# Patient Record
Sex: Male | Born: 1950 | Race: Black or African American | Hispanic: No | Marital: Married | Smoking: Former smoker
Health system: Southern US, Community
[De-identification: ages and names within clinical notes are randomized; demographics above are authoritative.]

## PROBLEM LIST (undated history)

## (undated) DIAGNOSIS — I10 Essential (primary) hypertension: Secondary | ICD-10-CM

## (undated) DIAGNOSIS — G4730 Sleep apnea, unspecified: Secondary | ICD-10-CM

## (undated) DIAGNOSIS — M1990 Unspecified osteoarthritis, unspecified site: Secondary | ICD-10-CM

## (undated) DIAGNOSIS — H409 Unspecified glaucoma: Secondary | ICD-10-CM

## (undated) DIAGNOSIS — E785 Hyperlipidemia, unspecified: Secondary | ICD-10-CM

## (undated) DIAGNOSIS — E119 Type 2 diabetes mellitus without complications: Secondary | ICD-10-CM

## (undated) HISTORY — PX: TONSILLECTOMY: SUR1361

## (undated) HISTORY — DX: Type 2 diabetes mellitus without complications: E11.9

## (undated) HISTORY — PX: APPENDECTOMY: SHX54

## (undated) HISTORY — DX: Hyperlipidemia, unspecified: E78.5

---

## 2005-04-07 ENCOUNTER — Ambulatory Visit: Payer: Self-pay | Admitting: Family Medicine

## 2005-08-03 ENCOUNTER — Ambulatory Visit: Payer: Self-pay | Admitting: Gastroenterology

## 2007-04-26 ENCOUNTER — Ambulatory Visit: Payer: Self-pay | Admitting: Family Medicine

## 2007-05-16 ENCOUNTER — Ambulatory Visit: Payer: Self-pay | Admitting: Family Medicine

## 2007-05-17 ENCOUNTER — Ambulatory Visit: Payer: Self-pay | Admitting: Family Medicine

## 2008-06-11 ENCOUNTER — Ambulatory Visit: Payer: Self-pay | Admitting: Family Medicine

## 2008-12-04 ENCOUNTER — Ambulatory Visit: Payer: Self-pay | Admitting: Family Medicine

## 2008-12-09 ENCOUNTER — Ambulatory Visit: Payer: Self-pay | Admitting: Urology

## 2009-03-21 ENCOUNTER — Ambulatory Visit: Payer: Self-pay | Admitting: Family Medicine

## 2012-07-11 ENCOUNTER — Ambulatory Visit: Payer: Self-pay | Admitting: Family Medicine

## 2012-09-13 ENCOUNTER — Ambulatory Visit: Payer: Self-pay | Admitting: Family Medicine

## 2013-05-23 ENCOUNTER — Other Ambulatory Visit: Payer: Self-pay | Admitting: Neurosurgery

## 2013-05-30 ENCOUNTER — Encounter (HOSPITAL_COMMUNITY): Payer: Self-pay | Admitting: Pharmacy Technician

## 2013-06-09 NOTE — Pre-Procedure Instructions (Signed)
[place] - Preparing for Surgery  Before surgery, you can play an important role.  Because skin is not sterile, your skin needs to be as free of germs as possible.  You can reduce the number of germs on you skin by washing with CHG (chlorahexidine gluconate) soap before surgery.  CHG is an antiseptic cleaner which kills germs and bonds with the skin to continue killing germs even after washing.  Please DO NOT use if you have an allergy to CHG or antibacterial soaps.  If your skin becomes reddened/irritated stop using the CHG and inform your nurse when you arrive at Short Stay.  Do not shave (including legs and underarms) for at least 48 hours prior to the first CHG shower.  You may shave your face.  Please follow these instructions carefully:   1.  Shower with CHG Soap the night before surgery and the morning of Surgery.  2.  If you choose to wash your hair, wash your hair first as usual with your normal shampoo.  3.  After you shampoo, rinse your hair and body thoroughly to remove the shampoo.  4.  Use CHG as you would any other liquid soap.  You can apply CHG directly to the skin and wash gently with scrungie or a clean washcloth.  5.  Apply the CHG Soap to your body ONLY FROM THE NECK DOWN.  Do not use on open wounds or open sores.  Avoid contact with your eyes, ears, mouth and genitals (private parts).  Wash genitals (private parts) with your normal soap.  6.  Wash thoroughly, paying special attention to the area where your surgery will be performed.  7.  Thoroughly rinse your body with warm water from the neck down.  8.  DO NOT shower/wash with your normal soap after using and rinsing off the CHG Soap.  9.  Pat yourself dry with a clean towel.            10.  Wear clean pajamas.            11.  Place clean sheets on your bed the night of your first shower and do not sleep with pets.  Day of Surgery  Do not apply any lotions the morning of surgery.  Please wear clean clothes to the  hospital/surgery center.   

## 2013-06-09 NOTE — Pre-Procedure Instructions (Signed)
Mason Johnson  06/09/2013   Your procedure is scheduled on:  May 19  Report to [facility] Admitting at 05:30 AM.  Call this number if you have problems the morning of surgery: [phone]   Remember:   Do not eat food or drink liquids after midnight.   Take these medicines the morning of surgery with A SIP OF WATER: Bystolic, Eye drops, nasal spray   STOP Aspirin Tuesday, May 12   STOP/ Do not take Aspirin, Aleve, Naproxen, Advil, Ibuprofen, Vitamin, Herbs, or Supplements starting May 12   Do not wear jewelry, make-up or nail polish.  Do not wear lotions, powders, or perfumes. You may wear deodorant.  Do not shave 48 hours prior to surgery. Men may shave face and neck.  Do not bring valuables to the hospital.  [facility] is not responsible for any belongings or valuables.               Contacts, dentures or bridgework may not be worn into surgery.  Leave suitcase in the car. After surgery it may be brought to your room.  For patients admitted to the hospital, discharge time is determined by your treatment team.               Special Instructions: See [facility] Preparing For Surgery   Please read over the following fact sheets that you were given: Pain Booklet, Coughing and Deep Breathing and Surgical Site Infection Prevention

## 2013-06-11 ENCOUNTER — Encounter (HOSPITAL_COMMUNITY): Payer: Self-pay

## 2013-06-11 ENCOUNTER — Encounter (HOSPITAL_COMMUNITY)
Admission: RE | Admit: 2013-06-11 | Discharge: 2013-06-11 | Disposition: A | Discharge status: Home / Self Care | Source: Ambulatory Visit | Attending: Anesthesiology | Admitting: Anesthesiology

## 2013-06-11 ENCOUNTER — Encounter (HOSPITAL_COMMUNITY)
Admission: RE | Admit: 2013-06-11 | Discharge: 2013-06-11 | Disposition: A | Discharge status: Home / Self Care | Source: Ambulatory Visit | Attending: Neurosurgery | Admitting: Neurosurgery

## 2013-06-11 DIAGNOSIS — Z01812 Encounter for preprocedural laboratory examination: Secondary | ICD-10-CM | POA: Insufficient documentation

## 2013-06-11 DIAGNOSIS — Z01818 Encounter for other preprocedural examination: Secondary | ICD-10-CM | POA: Insufficient documentation

## 2013-06-11 DIAGNOSIS — Z01810 Encounter for preprocedural cardiovascular examination: Secondary | ICD-10-CM | POA: Insufficient documentation

## 2013-06-11 HISTORY — DX: Sleep apnea, unspecified: G47.30

## 2013-06-11 HISTORY — DX: Essential (primary) hypertension: I10

## 2013-06-11 HISTORY — DX: Unspecified glaucoma: H40.9

## 2013-06-11 HISTORY — DX: Unspecified osteoarthritis, unspecified site: M19.90

## 2013-06-11 LAB — CBC
HCT: 35.9 % — ABNORMAL LOW (ref 39.0–52.0)
Hemoglobin: 11.3 g/dL — ABNORMAL LOW (ref 13.0–17.0)
MCH: 23.8 pg — ABNORMAL LOW (ref 26.0–34.0)
MCHC: 31.5 g/dL (ref 30.0–36.0)
MCV: 75.7 fL — ABNORMAL LOW (ref 78.0–100.0)
Platelets: 330 10*3/uL (ref 150–400)
RBC: 4.74 MIL/uL (ref 4.22–5.81)
RDW: 18.6 % — ABNORMAL HIGH (ref 11.5–15.5)
WBC: 9.4 10*3/uL (ref 4.0–10.5)

## 2013-06-11 LAB — SURGICAL PCR SCREEN
MRSA, PCR: NEGATIVE
Staphylococcus aureus: NEGATIVE

## 2013-06-11 LAB — BASIC METABOLIC PANEL
BUN: 20 mg/dL (ref 6–23)
CO2: 24 mEq/L (ref 19–32)
Calcium: 9.4 mg/dL (ref 8.4–10.5)
Chloride: 104 mEq/L (ref 96–112)
Creatinine, Ser: 1.18 mg/dL (ref 0.50–1.35)
GFR calc Af Amer: 74 mL/min — ABNORMAL LOW (ref >90)
GFR calc non Af Amer: 64 mL/min — ABNORMAL LOW (ref >90)
Glucose, Bld: 130 mg/dL — ABNORMAL HIGH (ref 70–99)
Potassium: 4.2 mEq/L (ref 3.7–5.3)
Sodium: 141 mEq/L (ref 137–147)

## 2013-06-11 NOTE — Progress Notes (Signed)
Primary physician - dr. Ardine Bjork - [facility] in [place] Saw cardiologist about 3 years ago. Cannot remember his name. Had stress test and ekg at that appointment 3 years ago states that everything came back normal

## 2013-06-18 MED ORDER — DEXTROSE 5 % IV SOLN
3.0000 g | INTRAVENOUS | Status: AC
  Administered 2013-06-19: 3 g via INTRAVENOUS
  Filled 2013-06-18: qty 3000

## 2013-06-19 ENCOUNTER — Encounter (HOSPITAL_COMMUNITY): Admitting: Anesthesiology

## 2013-06-19 ENCOUNTER — Encounter (HOSPITAL_COMMUNITY)
Admission: RE | Disposition: A | Discharge status: Home / Self Care | Payer: Self-pay | Source: Ambulatory Visit | Attending: Neurosurgery

## 2013-06-19 ENCOUNTER — Observation Stay (HOSPITAL_COMMUNITY)
Admission: RE | Admit: 2013-06-19 | Discharge: 2013-06-21 | Disposition: A | Discharge status: Home / Self Care | Source: Ambulatory Visit | Attending: Neurosurgery | Admitting: Neurosurgery

## 2013-06-19 ENCOUNTER — Inpatient Hospital Stay (HOSPITAL_COMMUNITY)

## 2013-06-19 ENCOUNTER — Encounter (HOSPITAL_COMMUNITY): Payer: Self-pay | Admitting: *Deleted

## 2013-06-19 ENCOUNTER — Inpatient Hospital Stay (HOSPITAL_COMMUNITY): Admitting: Anesthesiology

## 2013-06-19 DIAGNOSIS — I10 Essential (primary) hypertension: Secondary | ICD-10-CM | POA: Insufficient documentation

## 2013-06-19 DIAGNOSIS — Z7982 Long term (current) use of aspirin: Secondary | ICD-10-CM | POA: Insufficient documentation

## 2013-06-19 DIAGNOSIS — H409 Unspecified glaucoma: Secondary | ICD-10-CM | POA: Insufficient documentation

## 2013-06-19 DIAGNOSIS — G4730 Sleep apnea, unspecified: Secondary | ICD-10-CM | POA: Insufficient documentation

## 2013-06-19 DIAGNOSIS — Z87891 Personal history of nicotine dependence: Secondary | ICD-10-CM | POA: Insufficient documentation

## 2013-06-19 DIAGNOSIS — M129 Arthropathy, unspecified: Secondary | ICD-10-CM | POA: Insufficient documentation

## 2013-06-19 DIAGNOSIS — M48061 Spinal stenosis, lumbar region without neurogenic claudication: Principal | ICD-10-CM | POA: Insufficient documentation

## 2013-06-19 HISTORY — PX: LUMBAR LAMINECTOMY/DECOMPRESSION MICRODISCECTOMY: SHX5026

## 2013-06-19 SURGERY — LUMBAR LAMINECTOMY/DECOMPRESSION MICRODISCECTOMY 3 LEVELS
Anesthesia: General

## 2013-06-19 MED ORDER — PHENYLEPHRINE 40 MCG/ML (10ML) SYRINGE FOR IV PUSH (FOR BLOOD PRESSURE SUPPORT)
PREFILLED_SYRINGE | INTRAVENOUS | Status: AC
  Filled 2013-06-19: qty 10

## 2013-06-19 MED ORDER — ROCURONIUM BROMIDE 100 MG/10ML IV SOLN
INTRAVENOUS | Status: DC | PRN
  Administered 2013-06-19: 10 mg via INTRAVENOUS
  Administered 2013-06-19: 30 mg via INTRAVENOUS
  Administered 2013-06-19: 20 mg via INTRAVENOUS

## 2013-06-19 MED ORDER — GELATIN ABSORBABLE MT POWD
OROMUCOSAL | Status: DC | PRN
Start: 2013-06-19 — End: 2013-06-19
  Administered 2013-06-19: 09:00:00 via TOPICAL

## 2013-06-19 MED ORDER — BUPIVACAINE HCL (PF) 0.5 % IJ SOLN
INTRAMUSCULAR | Status: DC | PRN
  Administered 2013-06-19: 5 mL

## 2013-06-19 MED ORDER — HEPARIN SODIUM (PORCINE) 5000 UNIT/ML IJ SOLN
5000.0000 [IU] | Freq: Three times a day (TID) | INTRAMUSCULAR | Status: DC
  Administered 2013-06-20 – 2013-06-21 (×4): 5000 [IU] via SUBCUTANEOUS
  Filled 2013-06-19 (×7): qty 1

## 2013-06-19 MED ORDER — MORPHINE SULFATE 2 MG/ML IJ SOLN
1.0000 mg | INTRAMUSCULAR | Status: DC | PRN
  Administered 2013-06-19 – 2013-06-20 (×4): 2 mg via INTRAVENOUS
  Filled 2013-06-19 (×5): qty 1

## 2013-06-19 MED ORDER — LACTATED RINGERS IV SOLN
INTRAVENOUS | Status: DC | PRN
  Administered 2013-06-19 (×2): via INTRAVENOUS

## 2013-06-19 MED ORDER — ROCURONIUM BROMIDE 50 MG/5ML IV SOLN
INTRAVENOUS | Status: AC
  Filled 2013-06-19: qty 1

## 2013-06-19 MED ORDER — PROPOFOL 10 MG/ML IV BOLUS
INTRAVENOUS | Status: AC
  Filled 2013-06-19: qty 20

## 2013-06-19 MED ORDER — ALBUMIN HUMAN 5 % IV SOLN
INTRAVENOUS | Status: DC | PRN
  Administered 2013-06-19: 09:00:00 via INTRAVENOUS

## 2013-06-19 MED ORDER — LIDOCAINE-EPINEPHRINE 1 %-1:100000 IJ SOLN
INTRAMUSCULAR | Status: DC | PRN
  Administered 2013-06-19: 5 mL

## 2013-06-19 MED ORDER — GLYCOPYRROLATE 0.2 MG/ML IJ SOLN
INTRAMUSCULAR | Status: DC | PRN
  Administered 2013-06-19: 0.4 mg via INTRAVENOUS

## 2013-06-19 MED ORDER — NEOSTIGMINE METHYLSULFATE 10 MG/10ML IV SOLN
INTRAVENOUS | Status: AC
  Filled 2013-06-19: qty 1

## 2013-06-19 MED ORDER — DEXAMETHASONE SODIUM PHOSPHATE 4 MG/ML IJ SOLN
INTRAMUSCULAR | Status: DC | PRN
  Administered 2013-06-19: 8 mg via INTRAVENOUS

## 2013-06-19 MED ORDER — PHENYLEPHRINE HCL 10 MG/ML IJ SOLN
INTRAMUSCULAR | Status: DC | PRN
  Administered 2013-06-19 (×2): 80 ug via INTRAVENOUS
  Administered 2013-06-19: 120 ug via INTRAVENOUS
  Administered 2013-06-19 (×3): 80 ug via INTRAVENOUS

## 2013-06-19 MED ORDER — HYDROCHLOROTHIAZIDE 25 MG PO TABS
25.0000 mg | ORAL_TABLET | Freq: Every day | ORAL | Status: DC
  Administered 2013-06-19 – 2013-06-21 (×3): 25 mg via ORAL
  Filled 2013-06-19 (×3): qty 1

## 2013-06-19 MED ORDER — HYDROMORPHONE HCL PF 1 MG/ML IJ SOLN: 0.2500 mg | INTRAMUSCULAR | Status: DC | PRN

## 2013-06-19 MED ORDER — CEFAZOLIN SODIUM 1-5 GM-% IV SOLN
1.0000 g | Freq: Three times a day (TID) | INTRAVENOUS | Status: AC
  Administered 2013-06-19: 1 g via INTRAVENOUS
  Filled 2013-06-19 (×2): qty 50

## 2013-06-19 MED ORDER — NEOSTIGMINE METHYLSULFATE 10 MG/10ML IV SOLN
INTRAVENOUS | Status: DC | PRN
  Administered 2013-06-19: 3 mg via INTRAVENOUS

## 2013-06-19 MED ORDER — HYDROMORPHONE HCL PF 1 MG/ML IJ SOLN
0.2500 mg | INTRAMUSCULAR | Status: AC | PRN
  Administered 2013-06-19 (×8): 0.5 mg via INTRAVENOUS

## 2013-06-19 MED ORDER — MIDAZOLAM HCL 5 MG/5ML IJ SOLN
INTRAMUSCULAR | Status: DC | PRN
  Administered 2013-06-19: 2 mg via INTRAVENOUS

## 2013-06-19 MED ORDER — ONDANSETRON HCL 4 MG/2ML IJ SOLN: 4.0000 mg | INTRAMUSCULAR | Status: DC | PRN

## 2013-06-19 MED ORDER — 0.9 % SODIUM CHLORIDE (POUR BTL) OPTIME
TOPICAL | Status: DC | PRN
  Administered 2013-06-19: 1000 mL

## 2013-06-19 MED ORDER — BACITRACIN 50000 UNITS IM SOLR
INTRAMUSCULAR | Status: DC | PRN
  Administered 2013-06-19: 09:00:00

## 2013-06-19 MED ORDER — MENTHOL 3 MG MT LOZG: 1.0000 | LOZENGE | OROMUCOSAL | Status: DC | PRN

## 2013-06-19 MED ORDER — THROMBIN 20000 UNITS EX SOLR
CUTANEOUS | Status: DC | PRN
  Administered 2013-06-19: 09:00:00 via TOPICAL

## 2013-06-19 MED ORDER — SODIUM CHLORIDE 0.9 % IV SOLN
INTRAVENOUS | Status: DC
  Administered 2013-06-19: via INTRAVENOUS

## 2013-06-19 MED ORDER — ATORVASTATIN CALCIUM 10 MG PO TABS
10.0000 mg | ORAL_TABLET | Freq: Every day | ORAL | Status: DC
  Administered 2013-06-20 – 2013-06-21 (×2): 10 mg via ORAL
  Filled 2013-06-19 (×3): qty 1

## 2013-06-19 MED ORDER — LOSARTAN POTASSIUM 50 MG PO TABS
100.0000 mg | ORAL_TABLET | Freq: Every day | ORAL | Status: DC
  Administered 2013-06-19 – 2013-06-21 (×3): 100 mg via ORAL
  Filled 2013-06-19 (×3): qty 2

## 2013-06-19 MED ORDER — SODIUM CHLORIDE 0.9 % IJ SOLN
3.0000 mL | Freq: Two times a day (BID) | INTRAMUSCULAR | Status: DC
  Administered 2013-06-19 – 2013-06-20 (×2): 3 mL via INTRAVENOUS

## 2013-06-19 MED ORDER — PHENOL 1.4 % MT LIQD: 1.0000 | OROMUCOSAL | Status: DC | PRN

## 2013-06-19 MED ORDER — ACETAMINOPHEN 650 MG RE SUPP: 650.0000 mg | RECTAL | Status: DC | PRN

## 2013-06-19 MED ORDER — LIDOCAINE HCL (CARDIAC) 20 MG/ML IV SOLN
INTRAVENOUS | Status: AC
  Filled 2013-06-19: qty 5

## 2013-06-19 MED ORDER — HYDROMORPHONE HCL PF 1 MG/ML IJ SOLN
INTRAMUSCULAR | Status: AC
  Administered 2013-06-19: 0.5 mg via INTRAVENOUS
  Filled 2013-06-19: qty 2

## 2013-06-19 MED ORDER — SENNA 8.6 MG PO TABS
1.0000 | ORAL_TABLET | Freq: Two times a day (BID) | ORAL | Status: DC
  Administered 2013-06-19 – 2013-06-21 (×4): 8.6 mg via ORAL
  Filled 2013-06-19 (×5): qty 1

## 2013-06-19 MED ORDER — TRIAMCINOLONE ACETONIDE 55 MCG/ACT NA AERO
2.0000 | INHALATION_SPRAY | Freq: Every day | NASAL | Status: DC
  Filled 2013-06-19: qty 21.6

## 2013-06-19 MED ORDER — ONDANSETRON HCL 4 MG/2ML IJ SOLN
INTRAMUSCULAR | Status: DC | PRN
  Administered 2013-06-19: 4 mg via INTRAVENOUS

## 2013-06-19 MED ORDER — TIMOLOL MALEATE 0.5 % OP SOLN
1.0000 [drp] | Freq: Every day | OPHTHALMIC | Status: DC
  Filled 2013-06-19: qty 5

## 2013-06-19 MED ORDER — LIDOCAINE HCL (CARDIAC) 20 MG/ML IV SOLN
INTRAVENOUS | Status: DC | PRN
  Administered 2013-06-19: 200 mg via INTRATRACHEAL

## 2013-06-19 MED ORDER — OXYCODONE-ACETAMINOPHEN 5-325 MG PO TABS
1.0000 | ORAL_TABLET | ORAL | Status: DC | PRN
  Administered 2013-06-19: 1 via ORAL
  Administered 2013-06-20 – 2013-06-21 (×5): 2 via ORAL
  Filled 2013-06-19 (×5): qty 2

## 2013-06-19 MED ORDER — ONDANSETRON HCL 4 MG/2ML IJ SOLN: 4.0000 mg | Freq: Once | INTRAMUSCULAR | Status: DC | PRN

## 2013-06-19 MED ORDER — ARTIFICIAL TEARS OP OINT
TOPICAL_OINTMENT | OPHTHALMIC | Status: DC | PRN
Start: 2013-06-19 — End: 2013-06-19
  Administered 2013-06-19: 1 via OPHTHALMIC

## 2013-06-19 MED ORDER — LOSARTAN POTASSIUM-HCTZ 100-25 MG PO TABS: 1.0000 | ORAL_TABLET | Freq: Every day | ORAL | Status: DC

## 2013-06-19 MED ORDER — NEBIVOLOL HCL 10 MG PO TABS
10.0000 mg | ORAL_TABLET | Freq: Every day | ORAL | Status: DC
  Administered 2013-06-20 – 2013-06-21 (×2): 10 mg via ORAL
  Filled 2013-06-19 (×3): qty 1

## 2013-06-19 MED ORDER — SODIUM CHLORIDE 0.9 % IV SOLN
10.0000 mg | INTRAVENOUS | Status: DC | PRN
  Administered 2013-06-19: 10 ug/min via INTRAVENOUS

## 2013-06-19 MED ORDER — OXYCODONE-ACETAMINOPHEN 5-325 MG PO TABS
ORAL_TABLET | ORAL | Status: AC
  Administered 2013-06-19: 1 via ORAL
  Filled 2013-06-19: qty 2

## 2013-06-19 MED ORDER — SODIUM CHLORIDE 0.9 % IV SOLN: 250.0000 mL | INTRAVENOUS | Status: DC

## 2013-06-19 MED ORDER — SODIUM CHLORIDE 0.9 % IJ SOLN: 3.0000 mL | INTRAMUSCULAR | Status: DC | PRN

## 2013-06-19 MED ORDER — PROPOFOL 10 MG/ML IV BOLUS
INTRAVENOUS | Status: DC | PRN
  Administered 2013-06-19: 300 mg via INTRAVENOUS
  Administered 2013-06-19 (×3): 20 mg via INTRAVENOUS

## 2013-06-19 MED ORDER — DOCUSATE SODIUM 100 MG PO CAPS
100.0000 mg | ORAL_CAPSULE | Freq: Two times a day (BID) | ORAL | Status: DC
  Administered 2013-06-19 – 2013-06-21 (×4): 100 mg via ORAL
  Filled 2013-06-19 (×4): qty 1

## 2013-06-19 MED ORDER — DIAZEPAM 5 MG PO TABS
5.0000 mg | ORAL_TABLET | Freq: Three times a day (TID) | ORAL | Status: DC | PRN
  Administered 2013-06-19 – 2013-06-20 (×4): 5 mg via ORAL
  Filled 2013-06-19 (×3): qty 1

## 2013-06-19 MED ORDER — FENTANYL CITRATE 0.05 MG/ML IJ SOLN
INTRAMUSCULAR | Status: DC | PRN
  Administered 2013-06-19 (×3): 50 ug via INTRAVENOUS
  Administered 2013-06-19: 150 ug via INTRAVENOUS

## 2013-06-19 MED ORDER — ONDANSETRON HCL 4 MG/2ML IJ SOLN
INTRAMUSCULAR | Status: AC
  Filled 2013-06-19: qty 2

## 2013-06-19 MED ORDER — FENTANYL CITRATE 0.05 MG/ML IJ SOLN
INTRAMUSCULAR | Status: AC
  Filled 2013-06-19: qty 5

## 2013-06-19 MED ORDER — ARTIFICIAL TEARS OP OINT
TOPICAL_OINTMENT | OPHTHALMIC | Status: AC
  Filled 2013-06-19: qty 3.5

## 2013-06-19 MED ORDER — ACETAMINOPHEN 325 MG PO TABS: 650.0000 mg | ORAL_TABLET | ORAL | Status: DC | PRN

## 2013-06-19 MED ORDER — MIDAZOLAM HCL 2 MG/2ML IJ SOLN
INTRAMUSCULAR | Status: AC
  Filled 2013-06-19: qty 2

## 2013-06-19 MED ORDER — SUCCINYLCHOLINE CHLORIDE 20 MG/ML IJ SOLN
INTRAMUSCULAR | Status: DC | PRN
  Administered 2013-06-19: 100 mg via INTRAVENOUS

## 2013-06-19 MED ORDER — DIAZEPAM 5 MG PO TABS
ORAL_TABLET | ORAL | Status: AC
  Administered 2013-06-19: 5 mg via ORAL
  Filled 2013-06-19: qty 1

## 2013-06-19 MED ORDER — DEXAMETHASONE SODIUM PHOSPHATE 4 MG/ML IJ SOLN
INTRAMUSCULAR | Status: AC
  Filled 2013-06-19: qty 2

## 2013-06-19 MED ORDER — GLYCOPYRROLATE 0.2 MG/ML IJ SOLN
INTRAMUSCULAR | Status: AC
  Filled 2013-06-19: qty 2

## 2013-06-19 SURGICAL SUPPLY — 65 items
BAG DECANTER FOR FLEXI CONT (MISCELLANEOUS) ×3 IMPLANT
BENZOIN TINCTURE PRP APPL 2/3 (GAUZE/BANDAGES/DRESSINGS) IMPLANT
BLADE 10 SAFETY STRL DISP (BLADE) ×3 IMPLANT
BLADE SURG 11 STRL SS (BLADE) ×3 IMPLANT
BLADE SURG ROTATE 9660 (MISCELLANEOUS) IMPLANT
BUR MATCHSTICK NEURO 3.0 LAGG (BURR) ×3 IMPLANT
CANISTER SUCT 3000ML (MISCELLANEOUS) ×3 IMPLANT
CLOSURE WOUND 1/2 X4 (GAUZE/BANDAGES/DRESSINGS)
CONT SPEC 4OZ CLIKSEAL STRL BL (MISCELLANEOUS) ×3 IMPLANT
DECANTER SPIKE VIAL GLASS SM (MISCELLANEOUS) ×3 IMPLANT
DERMABOND ADHESIVE PROPEN (GAUZE/BANDAGES/DRESSINGS) ×2
DERMABOND ADVANCED (GAUZE/BANDAGES/DRESSINGS) ×2
DERMABOND ADVANCED .7 DNX12 (GAUZE/BANDAGES/DRESSINGS) ×1 IMPLANT
DERMABOND ADVANCED .7 DNX6 (GAUZE/BANDAGES/DRESSINGS) ×1 IMPLANT
DRAPE LAPAROTOMY 100X72X124 (DRAPES) ×3 IMPLANT
DRAPE MICROSCOPE LEICA (MISCELLANEOUS) ×3 IMPLANT
DRAPE POUCH INSTRU U-SHP 10X18 (DRAPES) ×3 IMPLANT
DRAPE SURG 17X23 STRL (DRAPES) ×3 IMPLANT
DRSG OPSITE POSTOP 4X6 (GAUZE/BANDAGES/DRESSINGS) ×3 IMPLANT
DURAPREP 26ML APPLICATOR (WOUND CARE) ×3 IMPLANT
ELECT REM PT RETURN 9FT ADLT (ELECTROSURGICAL) ×3
ELECTRODE REM PT RTRN 9FT ADLT (ELECTROSURGICAL) ×1 IMPLANT
EVACUATOR 1/8 PVC DRAIN (DRAIN) ×3 IMPLANT
GAUZE SPONGE 4X4 16PLY XRAY LF (GAUZE/BANDAGES/DRESSINGS) IMPLANT
GLOVE BIOGEL PI IND STRL 7.0 (GLOVE) ×3 IMPLANT
GLOVE BIOGEL PI IND STRL 7.5 (GLOVE) ×1 IMPLANT
GLOVE BIOGEL PI INDICATOR 7.0 (GLOVE) ×6
GLOVE BIOGEL PI INDICATOR 7.5 (GLOVE) ×2
GLOVE ECLIPSE 6.5 STRL STRAW (GLOVE) ×3 IMPLANT
GLOVE ECLIPSE 7.0 STRL STRAW (GLOVE) ×3 IMPLANT
GLOVE EXAM NITRILE LRG STRL (GLOVE) IMPLANT
GLOVE EXAM NITRILE MD LF STRL (GLOVE) IMPLANT
GLOVE EXAM NITRILE XL STR (GLOVE) IMPLANT
GLOVE EXAM NITRILE XS STR PU (GLOVE) IMPLANT
GLOVE SURG SS PI 7.0 STRL IVOR (GLOVE) ×12 IMPLANT
GOWN STRL REUS W/ TWL LRG LVL3 (GOWN DISPOSABLE) ×2 IMPLANT
GOWN STRL REUS W/ TWL XL LVL3 (GOWN DISPOSABLE) ×1 IMPLANT
GOWN STRL REUS W/TWL 2XL LVL3 (GOWN DISPOSABLE) IMPLANT
GOWN STRL REUS W/TWL LRG LVL3 (GOWN DISPOSABLE) ×4
GOWN STRL REUS W/TWL XL LVL3 (GOWN DISPOSABLE) ×2
HEMOSTAT POWDER KIT SURGIFOAM (HEMOSTASIS) ×3 IMPLANT
KIT BASIN OR (CUSTOM PROCEDURE TRAY) ×3 IMPLANT
KIT ROOM TURNOVER OR (KITS) ×3 IMPLANT
NEEDLE HYPO 18GX1.5 BLUNT FILL (NEEDLE) IMPLANT
NEEDLE HYPO 25X1 1.5 SAFETY (NEEDLE) ×3 IMPLANT
NEEDLE SPNL 18GX3.5 QUINCKE PK (NEEDLE) IMPLANT
NS IRRIG 1000ML POUR BTL (IV SOLUTION) ×3 IMPLANT
PACK LAMINECTOMY NEURO (CUSTOM PROCEDURE TRAY) ×3 IMPLANT
PAD ARMBOARD 7.5X6 YLW CONV (MISCELLANEOUS) ×9 IMPLANT
RUBBERBAND STERILE (MISCELLANEOUS) ×6 IMPLANT
SPONGE GAUZE 4X4 12PLY (GAUZE/BANDAGES/DRESSINGS) IMPLANT
SPONGE LAP 4X18 X RAY DECT (DISPOSABLE) IMPLANT
SPONGE SURGIFOAM ABS GEL SZ50 (HEMOSTASIS) ×3 IMPLANT
STRIP CLOSURE SKIN 1/2X4 (GAUZE/BANDAGES/DRESSINGS) IMPLANT
SUT VIC AB 0 CT1 18XCR BRD8 (SUTURE) ×3 IMPLANT
SUT VIC AB 0 CT1 8-18 (SUTURE) ×6
SUT VIC AB 2-0 CT1 18 (SUTURE) IMPLANT
SUT VIC AB 3-0 FS2 27 (SUTURE) IMPLANT
SUT VIC AB 3-0 SH 8-18 (SUTURE) ×3 IMPLANT
SUT VICRYL 3-0 RB1 18 ABS (SUTURE) ×6 IMPLANT
SYR 20ML ECCENTRIC (SYRINGE) ×3 IMPLANT
SYR 3ML LL SCALE MARK (SYRINGE) IMPLANT
TOWEL OR 17X24 6PK STRL BLUE (TOWEL DISPOSABLE) ×3 IMPLANT
TOWEL OR 17X26 10 PK STRL BLUE (TOWEL DISPOSABLE) ×3 IMPLANT
WATER STERILE IRR 1000ML POUR (IV SOLUTION) ×3 IMPLANT

## 2013-06-19 NOTE — Progress Notes (Signed)
Set pt up on Cpap full face mask 9 cmh20 tolerating well

## 2013-06-19 NOTE — Op Note (Signed)
PREOP DIAGNOSIS: Lumbar spinal stenosis, L2-3, L3-4, L4-5  POSTOP DIAGNOSIS: Same  PROCEDURE: 1. L2-L5 decompressive laminectomy 2. Use of intraoperative microscope for microdissection  SURGEON: Dr. Consuella Lose, MD  ASSISTANT: Dr. Dayton Bailiff, MD  ANESTHESIA: General Endotracheal  EBL: 300cc  SPECIMENS: None  DRAINS: Subfacial medium hemovac  COMPLICATIONS: None immediate  CONDITION: Stable to PCAU  HISTORY: Mason Johnson is a 63 y.o. male who initially presented to the outpatient clinic with symptoms consistent with neurogenic claudication. He underwent MRI scan demonstrating multilevel lumbar stenosis worst at L2-3, L3-4, and L4-5. He failed conservative treatments, and elected to proceed with surgical decompression by laminectomy at L2-L5.  PROCEDURE IN DETAIL: After informed consent was obtained and witnessed, the patient was brought to the operating room. After induction of general anesthesia, the patient was positioned on the operative table in the prone position with all pressure points meticulously padded. The skin of the low back was then prepped and draped in the usual sterile fashion.  After timeout was conducted, the skin was infiltrated with local anesthetic. Skin incision was then made sharply and Bovie electrocautery was used to dissect the subcutaneous tissue until the lumbodorsal fascia was identified. The fascia was then incised using Bovie electrocautery and the lamina at the L3 level was identified by Xray, and dissection was carried out in the subperiosteal plane to expose the inferior aspect of L2, all of L3, L4, and the top of L5. Self-retaining retractor was then placed.  Using a high-speed drill, laminectomies were completed at inferior L2, L3, L4, and superior portion of L5. The ligamentum flavum was then identified and removed and the lateral edge of the thecal sac was identified bilaterally. Using a combination of the high-speed drill and rongeurs,  good lateral decompression was completed. The decompression was confirmed using a small ball tip dissector.  Hemostasis was then secured using a combination of morcellized Gelfoam and thrombin and bipolar electrocautery. The wound is irrigated with copious amounts of antibiotic saline irrigation.Self-retaining retractor was then removed, and a medium Hemovac drain was placed and tunneled subcutaneously. The wound was closed in layers using a combination of interrupted 0 Vicryl and 3-0 Vicryl stitches. The skin was closed using standard skin glue.  At the end of the case all sponge, needle, and instrument counts were correct. The patient was then transferred to the stretcher and taken to the postanesthesia care unit in stable hemodynamic condition.

## 2013-06-19 NOTE — Progress Notes (Signed)
Pt arrived on unit via stretcher at 1830. Pt A&O x4, MAE x4, foley and hemavac remains intact with 40ml emptied from hemavac, HC dsg to back clean, dry and intact; scd's on; pt oriented to unit with call light within reach. Pt pain assessed and pt reported being comfortable upon arrival to the floor but at 1900 pt report having pain to the LLE. Pt repositioned, pain med given pt advised to ROM to the leg. Pt voices relieve and is sleeping. Pt with family in room and call light within reach. Will continue to monitor and report given off to incoming RN. Francis Gaines Tino Ronan RN.

## 2013-06-19 NOTE — Anesthesia Preprocedure Evaluation (Signed)
Anesthesia Evaluation  Patient identified by MRN, date of birth, ID band Patient awake    Reviewed: Allergy & Precautions, H&P , NPO status , Patient's Chart, lab work & pertinent test results  Airway       Dental   Pulmonary sleep apnea , former smoker,          Cardiovascular hypertension,     Neuro/Psych    GI/Hepatic   Endo/Other  Morbid obesity  Renal/GU      Musculoskeletal   Abdominal   Peds  Hematology   Anesthesia Other Findings   Reproductive/Obstetrics                           Anesthesia Physical Anesthesia Plan  ASA: III  Anesthesia Plan: General   Post-op Pain Management:    Induction: Intravenous  Airway Management Planned: Oral ETT  Additional Equipment:   Intra-op Plan:   Post-operative Plan: Extubation in OR  Informed Consent: I have reviewed the patients History and Physical, chart, labs and discussed the procedure including the risks, benefits and alternatives for the proposed anesthesia with the patient or authorized representative who has indicated his/her understanding and acceptance.     Plan Discussed with:   Anesthesia Plan Comments:         Anesthesia Quick Evaluation

## 2013-06-19 NOTE — H&P (Signed)
CC:  Leg pain  HPI: Mason Johnson is a 63 year old man who presents today for elective lumbar laminectomy. Initially presented in the outpatient neurosurgery clinic with the complaint of bilateral lower extremity pain he describe it initially as a tightness in his calves, which progressively got worse and developed into numbness and tingling in his thighs, calves, and the dorsum of his feet. This has been progressive over the course of approximately 6 years. The pain develops as he walks or stands for any length of time, and is somewhat relieved when he sits down. He did attempt a trial of conservative management including pain medication, muscle relaxers, and steroids. These did not provide any significant relief. He was also referred for a course of physical therapy and epidural steroid injections which again provided minimal relief. Given the severity of symptoms preventing his normal daily activities, he elected to proceed with surgical decompression.     PMH: Past Medical History  Diagnosis Date  . Hypertension   . Sleep apnea     cpap occasionally  . Arthritis   . Glaucoma (increased eye pressure)     PSH: Past Surgical History  Procedure Laterality Date  . Tonsillectomy    . Appendectomy      SH: History  Substance Use Topics  . Smoking status: Former Smoker -- 50 years    Types: Cigarettes    Quit date: 02/11/2013  . Smokeless tobacco: Never Used  . Alcohol Use: 2.4 oz/week    4 Shots of liquor per week     Comment: daily cocktails 3-4    MEDS: Prior to Admission medications   Medication Sig Start Date End Date Taking? Authorizing Provider  aspirin EC 81 MG tablet Take 81 mg by mouth daily.   Yes Historical Provider, MD  atorvastatin (LIPITOR) 10 MG tablet Take 10 mg by mouth daily.   Yes Historical Provider, MD  losartan-hydrochlorothiazide (HYZAAR) 100-25 MG per tablet Take 1 tablet by mouth daily.   Yes Historical Provider, MD  nebivolol (BYSTOLIC) 10 MG tablet Take  10 mg by mouth daily.   Yes Historical Provider, MD  timolol (BETIMOL) 0.5 % ophthalmic solution Place 1 drop into both eyes daily.   Yes Historical Provider, MD  triamcinolone (NASACORT ALLERGY 24HR) 55 MCG/ACT AERO nasal inhaler Place 2 sprays into the nose daily.   Yes Historical Provider, MD    ALLERGY: No Known Allergies  ROS REVIEW OF SYSTEMS:  System Neg/Pos Details  Constitutional Negative Chills, fatigue, fever, malaise, night sweats, weight gain and weight loss.  ENMT Negative Ear drainage, hearing loss, nasal drainage, otalgia, sinus pressure and sore throat.  Eyes Negative Eye discharge, eye pain and vision changes.  Respiratory Negative Chronic cough, cough, dyspnea, known TB exposure and wheezing.  Cardio Negative Chest pain, claudication, edema and irregular heartbeat/palpitations.  GI Negative Abdominal pain, blood in stool, change in stool pattern, constipation, decreased appetite, diarrhea, heartburn, nausea and vomiting.  Endocrine Negative Cold intolerance, heat intolerance, polydipsia and polyphagia.  Neuro Negative Dizziness, extremity weakness, gait disturbance, headache, memory impairment, numbness in extremities, seizures and tremors.  Psych Negative Anxiety, depression and insomnia.  MS Positive Back pain.  MS Negative Joint pain, joint swelling, muscle weakness and neck pain.  Hema/Lymph Negative Easy bleeding, easy bruising and lymphadenopathy.  Allergic/Immuno Negative Contact allergy, environmental allergies, food allergies and seasonal allergies.  Reproductive Negative Penile discharge and sexual dysfunction.  GU Negative Dribbling, dysuria, erectile dysfunction, hematuria, polyuria, slow stream, urinary frequency, urinary incontinence and urinary retention.  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   IMPRESSION: - 63 y.o. male lumbar spinal stenosis from L2-3 the L4-5 was failed conservative management   PLAN: - Decompressive lumbar laminectomy from L2-3 to L4-5 perioperative Ancef - Perioperative Ancef - Likely general neuroscience floor bed postoperatively

## 2013-06-19 NOTE — Anesthesia Postprocedure Evaluation (Signed)
  Anesthesia Post-op Note  Patient: Mason Johnson  Procedure(s) Performed: Procedure(s): LUMBAR LAMINECTOMY/DECOMPRESSION MICRODISCECTOMY 3 LEVELS  lumbar two/three, three/four, four/five (N/A)  Patient Location: PACU  Anesthesia Type:General  Level of Consciousness: awake, alert , oriented and patient cooperative  Airway and Oxygen Therapy: Patient Spontanous Breathing  Post-op Pain: moderate  Post-op Assessment: Post-op Vital signs reviewed, Patient's Cardiovascular Status Stable, Respiratory Function Stable, Patent Airway, No signs of Nausea or vomiting and Pain level controlled  Post-op Vital Signs: stable  Last Vitals:  Filed Vitals:   06/19/13 1145  BP: 124/47  Pulse: 64  Temp:   Resp: 14    Complications: No apparent anesthesia complications

## 2013-06-19 NOTE — Transfer of Care (Signed)
Immediate Anesthesia Transfer of Care Note  Patient: Mason Johnson  Procedure(s) Performed: Procedure(s): LUMBAR LAMINECTOMY/DECOMPRESSION MICRODISCECTOMY 3 LEVELS  lumbar two/three, three/four, four/five (N/A)  Patient Location: PACU  Anesthesia Type:General  Level of Consciousness: awake and responds to stimulation  Airway & Oxygen Therapy: Patient Spontanous Breathing and Patient connected to face mask oxygen  Post-op Assessment: Report given to PACU RN, Post -op Vital signs reviewed and stable and Patient moving all extremities X 4  Post vital signs: Reviewed and stable  Complications: No apparent anesthesia complications

## 2013-06-20 NOTE — Progress Notes (Signed)
Occupational Therapy Evaluation Patient Details Name: Mason Johnson MRN: [id number] DOB: 01-05-1951 Today's Date: 06/20/2013    History of Present Illness Pt is 63 y.o Male s/p lumbar laminectomy/decompression microdiscectomy 3 levels on 06/19/13.   Clinical Impression   PTA pt lived at home and required assistance for ADLs. Pt reports that his wife assisted with donning/doffing his socks, shoes, and underwear. Education and training completed for 3/3 back precautions and incorporating into ADLs. Pt would benefit from skilled OT to address LB ADLs with use of AE and safe tub transfer.     Follow Up Recommendations  No OT follow up;Supervision/Assistance - 24 hour    Equipment Recommendations  None recommended by OT       Precautions / Restrictions Precautions Precautions: Back;Fall Precaution Booklet Issued: Yes (comment) Precaution Comments: Educated pt on 3/3 back precautions and incorporating into ADLs. Restrictions Weight Bearing Restrictions: No      Mobility Bed Mobility Overal bed mobility: Needs Assistance Bed Mobility: Rolling;Sidelying to Sit;Sit to Sidelying Rolling: Min assist Sidelying to sit: Min assist     Sit to sidelying: Min guard General bed mobility comments: Verbal cues for log roll technique and (A) required to perform bed mobility.   Transfers Overall transfer level: Needs assistance Equipment used: Rolling walker (2 wheeled) Transfers: Sit to/from Stand Sit to Stand: Min guard         General transfer comment: Pt able to perform sit<>stand using RW without physical assistance. Verbal cues for hand placement and safety.    Balance Overall balance assessment: Needs assistance Sitting-balance support: No upper extremity supported;Feet supported Sitting balance-Leahy Scale: Fair     Standing balance support: During functional activity;Single extremity supported Standing balance-Leahy Scale: Fair                               ADL Overall ADL's : Needs assistance/impaired Eating/Feeding: Independent;Sitting   Grooming: Min guard;Standing   Upper Body Bathing: Supervision/ safety;Set up;Sitting   Lower Body Bathing: Minimal assistance;Sit to/from stand;Cueing for back precautions   Upper Body Dressing : Supervision/safety;Set up;Sitting   Lower Body Dressing: Moderate assistance;Sit to/from stand;Cueing for back precautions   Toilet Transfer: Min guard;Regular Toilet;RW (sit<>stand)   Toileting- Clothing Manipulation and Hygiene: Minimal assistance;Sit to/from stand;Cueing for back precautions Toileting - Clothing Manipulation Details (indicate cue type and reason): Educated pt on use of tongs and baby wipes for ease of toilet hyigene with back precautions. Tub/ Shower Transfer: Tub transfer;Total assistance Tub/Shower Transfer Details (indicate cue type and reason): pt not yet ready to try tub transfer. To be addressed at later session.   General ADL Comments: Pt c/o fatigue this date and quickly fatigued sitting EOB. Pt performed sit<>stand with RW with min guard but requested to return to bed due to fatigue. Pt unable to cross feet to reach LEs  without breaking back precautions. Pt reports his wife will be available 24/7 to assist.      Vision  Per pt report, no change in baseline.                   Perception Perception Perception Tested?: No   Praxis Praxis Praxis tested?: Within functional limits    Pertinent Vitals/Pain No c/o pain; pt reports fatigue and soreness.     Hand Dominance Left   Extremity/Trunk Assessment Upper Extremity Assessment Upper Extremity Assessment: Overall WFL for tasks assessed   Lower Extremity Assessment Lower Extremity Assessment: Defer to  PT evaluation   Cervical / Trunk Assessment Cervical / Trunk Assessment: Normal   Communication Communication Communication: No difficulties   Cognition Arousal/Alertness: Lethargic Behavior During  Therapy: Flat affect Overall Cognitive Status: Within Functional Limits for tasks assessed                                Home Living Family/patient expects to be discharged to:: Private residence Living Arrangements: Spouse/significant other Available Help at Discharge: Family;Available 24 hours/day Type of Home: House Home Access: Stairs to enter [employer] of Steps: 6 Entrance Stairs-Rails: Right Home Layout: Two level;Able to live on main level with bedroom/bathroom     Bathroom Shower/Tub: Tub/shower unit Shower/tub characteristics: Architectural technologist: Standard     Home Equipment: None          Prior Functioning/Environment Level of Independence: Needs assistance    ADL's / Homemaking Assistance Needed: Pt reports wife helped with socks, shoes, underwear.        OT Diagnosis: Generalized weakness;Acute pain   OT Problem List: Decreased strength;Decreased activity tolerance;Decreased range of motion;Impaired balance (sitting and/or standing);Decreased safety awareness;Decreased knowledge of use of DME or AE;Decreased knowledge of precautions;Pain   OT Treatment/Interventions: Self-care/ADL training;Therapeutic exercise;Energy conservation;DME and/or AE instruction;Therapeutic activities;Patient/family education;Balance training    OT Goals(Current goals can be found in the care plan section) Acute Rehab OT Goals Patient Stated Goal: to go home OT Goal Formulation: With patient Time For Goal Achievement: 06/27/13 Potential to Achieve Goals: Good ADL Goals Pt Will Perform Grooming: with modified independence;standing Pt Will Perform Lower Body Bathing: with modified independence;with adaptive equipment;sit to/from stand Pt Will Perform Lower Body Dressing: with modified independence;with adaptive equipment;sit to/from stand Pt Will Transfer to Toilet: with modified independence;ambulating;regular height toilet Pt Will Perform Toileting -  Clothing Manipulation and hygiene: with modified independence;with adaptive equipment;sit to/from stand Pt Will Perform Tub/Shower Transfer: Tub transfer;with supervision;ambulating;rolling walker Additional ADL Goal #1: Pt will perform bed mobility using log roll at supervision level to prepare for ADLs.  OT Frequency: Min 2X/week    End of Session Equipment Utilized During Treatment: Gait belt;Rolling walker  Activity Tolerance: Patient limited by fatigue Patient left: in bed;with call bell/phone within reach   Time: 1135-1148 OT Time Calculation (min): 13 min Charges:  OT General Charges $OT Visit: 1 Procedure OT Evaluation $Initial OT Evaluation Tier I: 1 Procedure G-Codes: OT G-codes **NOT FOR INPATIENT CLASS** Functional Assessment Tool Used: clinical judgment Functional Limitation: Self care Self Care Current Status [id number]): At least 1 percent but less than 20 percent impaired, limited or restricted Self Care Goal Status [id number]): 0 percent impaired, limited or restricted   Juluis Rainier [id number] 06/20/2013, 2:16 PM

## 2013-06-20 NOTE — Progress Notes (Signed)
UR completed 

## 2013-06-20 NOTE — Progress Notes (Signed)
No issues overnight. Pt reports good control of back pain with oral meds. He does c/o pain in the anterior aspect of both thighs.  EXAM:  BP 109/55  Pulse 66  Temp(Src) 98.4 F (36.9 C) (Oral)  Resp 20  Ht 6\' 1"  (1.854 m)  Wt 130.364 kg (287 lb 6.4 oz)  BMI 37.93 kg/m2  SpO2 93%  Awake, alert, oriented  Speech fluent, appropriate  CN grossly intact  5/5 BUE/BLE  Wound c/d/i  IMPRESSION:  63 y.o. male POD#1 s/p L2-L5 laminectomy for stenosis, neurologically intact  PLAN: - Ambulate with PT/OT today - Plan on D/C home tomorrow

## 2013-06-20 NOTE — Evaluation (Signed)
Physical Therapy Evaluation Patient Details Name: Mason Johnson MRN: [id number] DOB: July 03, 1950 Today's Date: 06/20/2013   History of Present Illness  Pt is 63 y.o Male s/p lumbar laminectomy/decompression microdiscectomy 3 levels on 06/19/13.  Pt with significant PMHx of HTN and self reports that he had exertional dyspnea PTA.   Clinical Impression  Pt is very lethargic, reports feeling weak and lightheaded on his feet.  He moved well, with only min assist throughout session, but likely will need reinforcement of mobility, reinforcement of education because he kept falling asleep during education piece of therapy eval.  PT will follow acutely.     Follow Up Recommendations No PT follow up;Supervision for mobility/OOB    Equipment Recommendations  Rolling walker with 5" wheels;3in1 (PT)    Recommendations for Other Services   NA    Precautions / Restrictions Precautions Precautions: Back;Fall Precaution Booklet Issued: Yes (comment) Precaution Comments: Pt able to report 2/3 back precautions.  Reinforced missing precaution and educated on no sitting >30-45 min, walk TID.  Pt eyes closed much of the session, so precautions need to be reinforced.  Restrictions Weight Bearing Restrictions: No      Mobility  Bed Mobility Overal bed mobility: Needs Assistance Bed Mobility: Rolling;Sidelying to Sit Rolling: Supervision Sidelying to sit: Min guard     Sit to sidelying: Min guard General bed mobility comments: Supervision and verbal cues for log roll technique with HOB flat and no rails, min guard to assist trunk to get from flat side lying to sitting without rails.   Transfers Overall transfer level: Needs assistance Equipment used: Rolling walker (2 wheeled) Transfers: Sit to/from Stand Sit to Stand: Min guard         General transfer comment: Min guard assist for safety and support of trunk as he slowly transitioned to his feet over very flexed knees.  Verbal cues for safe  hand placement.   Ambulation/Gait Ambulation/Gait assistance: Min assist Ambulation Distance (Feet): 80 Feet Assistive device: Rolling walker (2 wheeled) Gait Pattern/deviations: Step-through pattern;Trunk flexed     General Gait Details: Verbal cues for upright posture and to stay inside of RW during gait.  RW adjusted up for his height.           Balance Overall balance assessment: Needs assistance Sitting-balance support: Feet supported Sitting balance-Leahy Scale: Fair     Standing balance support: Bilateral upper extremity supported Standing balance-Leahy Scale: Poor                               Pertinent Vitals/Pain See vitals flow sheet.     Home Living Family/patient expects to be discharged to:: Private residence Living Arrangements: Spouse/significant other Available Help at Discharge: Family;Available 24 hours/day Type of Home: House Home Access: Stairs to enter Entrance Stairs-Rails: Right Entrance Stairs-Number of Steps: 6 Home Layout: Two level;Able to live on main level with bedroom/bathroom (nothing he needs to get to upstairs-guest rooms) Home Equipment: None      Prior Function Level of Independence: Needs assistance   Gait / Transfers Assistance Needed: independent  ADL's / Homemaking Assistance Needed: Pt reports wife helped with socks, shoes, underwear. (per OT report)        Hand Dominance   Dominant Hand: Left    Extremity/Trunk Assessment   Upper Extremity Assessment: Defer to OT evaluation           Lower Extremity Assessment: Overall WFL for tasks assessed (pt reports feeling  generally weak on his feet, but MMT WNL)      Cervical / Trunk Assessment: Normal  Communication   Communication: No difficulties  Cognition Arousal/Alertness: Lethargic Behavior During Therapy: WFL for tasks assessed/performed Overall Cognitive Status: Impaired/Different from baseline Area of Impairment: Attention   Current  Attention Level: Sustained (likely due to lethargy and pain meds) Memory: Decreased recall of precautions         General Comments: Pt having a hard time keeping his eyes open during my session even while walking.              Assessment/Plan    PT Assessment Patient needs continued PT services  PT Diagnosis Difficulty walking;Abnormality of gait;Generalized weakness;Acute pain   PT Problem List Decreased strength;Decreased activity tolerance;Decreased balance;Decreased mobility;Decreased cognition;Decreased knowledge of use of DME;Decreased knowledge of precautions;Pain  PT Treatment Interventions DME instruction;Gait training;Stair training;Therapeutic activities;Functional mobility training;Therapeutic exercise;Balance training;Neuromuscular re-education;Cognitive remediation;Patient/family education;Modalities   PT Goals (Current goals can be found in the Care Plan section) Acute Rehab PT Goals Patient Stated Goal: to get better and go home PT Goal Formulation: With patient Time For Goal Achievement: 06/27/13 Potential to Achieve Goals: Good    Frequency Min 5X/week        End of Session Equipment Utilized During Treatment: Gait belt Activity Tolerance: Patient limited by fatigue Patient left: in chair;with call bell/phone within reach;with chair alarm set Nurse Communication: Mobility status    Functional Assessment Tool Used: assist level Functional Limitation: Mobility: Walking and moving around Mobility: Walking and Moving Around Current Status [phone]): At least 20 percent but less than 40 percent impaired, limited or restricted Mobility: Walking and Moving Around Goal Status [phone]): At least 1 percent but less than 20 percent impaired, limited or restricted    Time: 1415-1441 PT Time Calculation (min): 26 min   Charges:   PT Evaluation $Initial PT Evaluation Tier I: 1 Procedure PT Treatments $Gait Training: 8-22 mins   PT G Codes:   Functional Assessment  Tool Used: assist level Functional Limitation: Mobility: Walking and moving around    Peck B. Clyde, [place], DPT [phone]   06/20/2013, 3:27 PM

## 2013-06-21 MED ORDER — OXYCODONE-ACETAMINOPHEN 5-325 MG PO TABS: 1.0000 | ORAL_TABLET | ORAL | Status: DC | PRN

## 2013-06-21 MED ORDER — DIAZEPAM 5 MG PO TABS: 5.0000 mg | ORAL_TABLET | Freq: Three times a day (TID) | ORAL | Status: DC | PRN

## 2013-06-21 NOTE — Progress Notes (Signed)
Physical Therapy Treatment Patient Details Name: Mason Johnson MRN: [id number] DOB: 09-26-1950 Today's Date: 06/21/2013    History of Present Illness Pt is 63 y.o Male s/p lumbar laminectomy/decompression microdiscectomy 3 levels on 06/19/13.  Pt with significant PMHx of HTN and self reports that he had exertional dyspnea PTA.     PT Comments    Pt in wheelchair for discharge upon PT arrival. Session focused on reviewing/educating back precautions; educated pt and friend on proper stair negotiation technique and car transfer technique. No mobility assessed during this session. Per evaluation and per pt; pt relying heavily on RW for mobility. Pt will need RW and 3 in 1 BSC for mobility and transfers upon D/C home.   Follow Up Recommendations  No PT follow up;Supervision for mobility/OOB     Equipment Recommendations  Rolling walker with 5" wheels;3in1 (PT)    Recommendations for Other Services       Precautions / Restrictions Precautions Precautions: Back;Fall Precaution Booklet Issued: Yes (comment) Precaution Comments: given new handout for back precautions; pt able to recall 2/3 independently; friend able to recall 3/3; re-educated on precautions  Restrictions Weight Bearing Restrictions: No    Mobility  Bed Mobility               General bed mobility comments: discussed/reviewed log rolling technique   Transfers                 General transfer comment: discussed/educated on car transfer technique to adhere to back precautions   Ambulation/Gait                 Stairs            Wheelchair Mobility    Modified Rankin (Stroke Patients Only)       Balance                                    Cognition Arousal/Alertness: Awake/alert Behavior During Therapy: WFL for tasks assessed/performed Overall Cognitive Status: Within Functional Limits for tasks assessed       Memory: Decreased recall of precautions          General Comments: difficulty recalling precautions     Exercises      General Comments General comments (skin integrity, edema, etc.): session focused on reiterating education on back precautions with pt and friend/signifcant other; educated pt on proper technique for stair ambulation; education pt and friend on proper car transfer technique; all questions answered regarding mobility and need for AD       Pertinent Vitals/Pain No c/o pain this session     Home Living                      Prior Function            PT Goals (current goals can now be found in the care plan section) Acute Rehab PT Goals Patient Stated Goal: home today! PT Goal Formulation: With patient/family Time For Goal Achievement: 06/27/13 Potential to Achieve Goals: Good Progress towards PT goals: Not progressing toward goals - comment (session focused on education only )    Frequency  Min 5X/week    PT Plan Current plan remains appropriate    Co-evaluation             End of Session   Activity Tolerance: Patient tolerated treatment well Patient left: Other (comment) (in wheelchair )  Time: [id number] PT Time Calculation (min): 9 min  Charges:  $Self Care/Home Management: 8-22                    G Codes:  Functional Assessment Tool Used: assist level Functional Limitation: Mobility: Walking and moving around Mobility: Walking and Moving Around Current Status [phone]): At least 20 percent but less than 40 percent impaired, limited or restricted Mobility: Walking and Moving Around Goal Status [phone]): At least 20 percent but less than 40 percent impaired, limited or restricted Mobility: Walking and Moving Around Discharge Status [phone]): At least 20 percent but less than 40 percent impaired, limited or restricted   [place], Verlean [place] 06/21/2013, 4:31 PM

## 2013-06-21 NOTE — Progress Notes (Signed)
Patient refused CPAP tonight. Patient to call if he changes his mind.

## 2013-06-21 NOTE — Discharge Summary (Signed)
  Physician Discharge Summary  Patient ID: Mason Johnson MRN: [id number] DOB/AGE: [birth year] 63 y.o.  Admit date: 06/19/2013 Discharge date: 06/21/2013  Admission Diagnoses: Lumbar spinal stenosis  Discharge Diagnoses: Same Active Problems:   Lumbar spinal stenosis   Discharged Condition: Stable  Hospital Course:  [name] is a 63 y.o. male electively admitted after undergoing L2-L5 laminectomy for spinal stenosis. He was at his neurologic baseline postoperatively and had an unventful recovery. His pain was controlled with oral medications by POD#2, he was ambulating without difficulty, and tolerating diet.  Treatments: Surgery - L2-L5 laminectomy  Discharge Exam: Blood pressure 123/59, pulse 75, temperature 98.1 F (36.7 C), temperature source Oral, resp. rate 20, height 6\' 1"  (1.854 m), weight 130.364 kg (287 lb 6.4 oz), SpO2 96.00%. Awake, alert, oriented Speech fluent, appropriate CN grossly intact 5/5 BUE/BLE Wound c/d/i  Follow-up: Follow-up in my office [facility] [phone]) in 2-3 weeks  Disposition: Home  Discharge Instructions   Discharge patient    Complete by:  As directed             Medication List         aspirin EC 81 MG tablet  Take 81 mg by mouth daily.     atorvastatin 10 MG tablet  Commonly known as:  LIPITOR  Take 10 mg by mouth daily.     diazepam 5 MG tablet  Commonly known as:  VALIUM  Take 1 tablet (5 mg total) by mouth every 8 (eight) hours as needed for anxiety.     losartan-hydrochlorothiazide 100-25 MG per tablet  Commonly known as:  HYZAAR  Take 1 tablet by mouth daily.     NASACORT ALLERGY 24HR 55 MCG/ACT Aero nasal inhaler  Generic drug:  triamcinolone  Place 2 sprays into the nose daily.     nebivolol 10 MG tablet  Commonly known as:  BYSTOLIC  Take 10 mg by mouth daily.     oxyCODONE-acetaminophen 5-325 MG per tablet  Commonly known as:  PERCOCET/ROXICET  Take 1-2 tablets by mouth  every 4 (four) hours as needed for moderate pain.     timolol 0.5 % ophthalmic solution  Commonly known as:  BETIMOL  Place 1 drop into both eyes daily.         SignedConsuella Lose 06/21/2013, 2:06 PM

## 2013-06-21 NOTE — Progress Notes (Signed)
Discharge instruction reviewed with patient/friend. All questions answered. RX given to patient.   Ave Filter, RN

## 2013-06-22 ENCOUNTER — Encounter (HOSPITAL_COMMUNITY): Payer: Self-pay | Admitting: Neurosurgery

## 2013-07-27 NOTE — OR Nursing (Signed)
Addendum to scope page 

## 2013-08-13 ENCOUNTER — Ambulatory Visit: Payer: Self-pay | Admitting: Family Medicine

## 2013-10-25 ENCOUNTER — Ambulatory Visit: Payer: Self-pay | Admitting: Family Medicine

## 2014-08-01 ENCOUNTER — Other Ambulatory Visit: Payer: Self-pay | Admitting: Family Medicine

## 2014-08-01 ENCOUNTER — Other Ambulatory Visit: Payer: Self-pay | Admitting: Neurosurgery

## 2014-08-05 ENCOUNTER — Other Ambulatory Visit: Payer: Self-pay | Admitting: Family Medicine

## 2014-08-06 ENCOUNTER — Other Ambulatory Visit: Payer: Self-pay | Admitting: Neurosurgery

## 2014-08-06 DIAGNOSIS — M48061 Spinal stenosis, lumbar region without neurogenic claudication: Secondary | ICD-10-CM

## 2014-08-07 ENCOUNTER — Other Ambulatory Visit: Payer: Self-pay | Admitting: Neurosurgery

## 2014-08-07 DIAGNOSIS — M48061 Spinal stenosis, lumbar region without neurogenic claudication: Secondary | ICD-10-CM

## 2014-08-17 ENCOUNTER — Other Ambulatory Visit

## 2014-08-29 ENCOUNTER — Ambulatory Visit (INDEPENDENT_AMBULATORY_CARE_PROVIDER_SITE_OTHER): Admitting: Family Medicine

## 2014-08-29 ENCOUNTER — Encounter: Payer: Self-pay | Admitting: Family Medicine

## 2014-08-29 VITALS — BP 118/60 | HR 86 | Temp 98.5°F | Resp 18 | Ht 73.0 in | Wt 300.5 lb

## 2014-08-29 DIAGNOSIS — E119 Type 2 diabetes mellitus without complications: Secondary | ICD-10-CM

## 2014-08-29 DIAGNOSIS — Z23 Encounter for immunization: Secondary | ICD-10-CM | POA: Diagnosis not present

## 2014-08-29 DIAGNOSIS — R609 Edema, unspecified: Secondary | ICD-10-CM

## 2014-08-29 DIAGNOSIS — E1169 Type 2 diabetes mellitus with other specified complication: Secondary | ICD-10-CM

## 2014-08-29 DIAGNOSIS — E669 Obesity, unspecified: Secondary | ICD-10-CM | POA: Diagnosis not present

## 2014-08-29 DIAGNOSIS — E785 Hyperlipidemia, unspecified: Secondary | ICD-10-CM | POA: Diagnosis not present

## 2014-08-29 DIAGNOSIS — I10 Essential (primary) hypertension: Secondary | ICD-10-CM | POA: Diagnosis not present

## 2014-08-29 DIAGNOSIS — L03317 Cellulitis of buttock: Secondary | ICD-10-CM | POA: Diagnosis not present

## 2014-08-29 DIAGNOSIS — J449 Chronic obstructive pulmonary disease, unspecified: Secondary | ICD-10-CM | POA: Diagnosis not present

## 2014-08-29 DIAGNOSIS — M109 Gout, unspecified: Secondary | ICD-10-CM | POA: Diagnosis not present

## 2014-08-29 DIAGNOSIS — G4733 Obstructive sleep apnea (adult) (pediatric): Secondary | ICD-10-CM | POA: Diagnosis not present

## 2014-08-29 LAB — POCT GLYCOSYLATED HEMOGLOBIN (HGB A1C): Hemoglobin A1C: 6.5

## 2014-08-29 LAB — GLUCOSE, POCT (MANUAL RESULT ENTRY): POC Glucose: 161 mg/dl — AB (ref 70–99)

## 2014-08-29 MED ORDER — SULFAMETHOXAZOLE-TRIMETHOPRIM 800-160 MG PO TABS: 1.0000 | ORAL_TABLET | Freq: Two times a day (BID) | ORAL | Status: DC

## 2014-08-29 NOTE — Patient Instructions (Signed)

## 2014-08-29 NOTE — Progress Notes (Signed)
Name: Mason Johnson   MRN: [id number]    DOB: 1950/08/09   Date:08/29/2014       Progress Note  Subjective  Chief Complaint  Chief Complaint  Patient presents with  . Foot Swelling    bilateral, more rt side   . Recurrent Skin Infections    on buttocks for 1 month    Hypertension This is a chronic problem. The current episode started more than 1 year ago. The problem is unchanged. The problem is controlled. Associated symptoms include peripheral edema. There are no associated agents to hypertension. Risk factors for coronary artery disease include dyslipidemia, obesity, male gender, sedentary lifestyle and stress. Past treatments include angiotensin blockers and beta blockers.  Rash This is a chronic problem. The current episode started more than 1 month ago. The problem has been gradually worsening since onset. The affected locations include the left buttock and right buttock.     Edema  Complaint of bilateral leg swelling with the right greater than the left.  Obesity  Patient has a long-standing history of obesity. He essentially has an unchanged weight since last visit. He is not following his diet with regularity nor is her regular exercise.    Past Medical History  Diagnosis Date  . Hypertension   . Sleep apnea     cpap occasionally  . Arthritis   . Glaucoma (increased eye pressure)     History  Substance Use Topics  . Smoking status: Former Smoker -- 50 years    Types: Cigarettes    Quit date: 02/11/2013  . Smokeless tobacco: Never Used  . Alcohol Use: 2.4 oz/week    4 Shots of liquor per week     Comment: daily cocktails 3-4     Current outpatient prescriptions:  .  aspirin EC 81 MG tablet, Take 81 mg by mouth daily., Disp: , Rfl:  .  atorvastatin (LIPITOR) 10 MG tablet, Take 10 mg by mouth daily., Disp: , Rfl:  .  CIALIS 10 MG tablet, TAKE 1 TABLET DAILY AS NEEDED, Disp: 18 tablet, Rfl: 0 .  diazepam (VALIUM) 5 MG tablet, Take 1 tablet (5 mg total) by  mouth every 8 (eight) hours as needed for anxiety., Disp: 60 tablet, Rfl: 0 .  losartan-hydrochlorothiazide (HYZAAR) 100-25 MG per tablet, TAKE 1 TABLET DAILY, Disp: 90 tablet, Rfl: 0 .  nebivolol (BYSTOLIC) 10 MG tablet, Take 10 mg by mouth daily., Disp: , Rfl:  .  oxyCODONE-acetaminophen (PERCOCET/ROXICET) 5-325 MG per tablet, Take 1-2 tablets by mouth every 4 (four) hours as needed for moderate pain., Disp: 60 tablet, Rfl: 0 .  timolol (BETIMOL) 0.5 % ophthalmic solution, Place 1 drop into both eyes daily., Disp: , Rfl:  .  triamcinolone (NASACORT ALLERGY 24HR) 55 MCG/ACT AERO nasal inhaler, Place 2 sprays into the nose daily., Disp: , Rfl:   No Known Allergies  Review of Systems  Skin: Positive for rash.     Objective  Filed Vitals:   08/29/14 0925  BP: 118/60  Pulse: 86  Temp: 98.5 F (36.9 C)  Resp: 18  Height: 6\' 1"  (1.854 m)  Weight: 300 lb 8 oz (136.306 kg)  SpO2: 95%     Physical Exam    Assessment & Plan  1. 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 Cellulitis of buttock As a result of diabetes combined with his sweat tendency during the summer months -  sulfamethoxazole-trimethoprim (BACTRIM DS) 800-160 MG per tablet; Take 1 tablet by mouth 2 (two) times daily.  Dispense: 28 tablet; Refill: 0 - Comprehensive Metabolic Panel (CMET)  3. Essential hypertension Controlled  4. Hyperlipidemia Lipid panel - Lipid Profile  5. Edema Obstructive sleep apnea without using his CPAP and summer edema  6. OSA (obstructive sleep apnea) Encouraged to use CPAP on a regular basis  7. Obesity Dietary consult    8. Gout, unspecified cause, unspecified chronicity, unspecified site Assess uric acid to assess need for uricosuric agent - Uric acid

## 2014-08-30 LAB — LIPID PANEL
Chol/HDL Ratio: 3.9 ratio units (ref 0.0–5.0)
Cholesterol, Total: 133 mg/dL (ref 100–199)
HDL: 34 mg/dL — ABNORMAL LOW (ref >39)
LDL Calculated: 73 mg/dL (ref 0–99)
Triglycerides: 130 mg/dL (ref 0–149)
VLDL Cholesterol Cal: 26 mg/dL (ref 5–40)

## 2014-08-30 LAB — COMPREHENSIVE METABOLIC PANEL
ALT: 14 IU/L (ref 0–44)
AST: 13 IU/L (ref 0–40)
Albumin/Globulin Ratio: 1.9 (ref 1.1–2.5)
Albumin: 4.6 g/dL (ref 3.6–4.8)
Alkaline Phosphatase: 52 IU/L (ref 39–117)
BUN/Creatinine Ratio: 15 (ref 10–22)
BUN: 18 mg/dL (ref 8–27)
Bilirubin Total: 0.3 mg/dL (ref 0.0–1.2)
CO2: 23 mmol/L (ref 18–29)
Calcium: 9.7 mg/dL (ref 8.6–10.2)
Chloride: 99 mmol/L (ref 97–108)
Creatinine, Ser: 1.2 mg/dL (ref 0.76–1.27)
GFR calc Af Amer: 73 mL/min/{1.73_m2} (ref >59)
GFR calc non Af Amer: 64 mL/min/{1.73_m2} (ref >59)
Globulin, Total: 2.4 g/dL (ref 1.5–4.5)
Glucose: 145 mg/dL — ABNORMAL HIGH (ref 65–99)
Potassium: 4.3 mmol/L (ref 3.5–5.2)
Sodium: 140 mmol/L (ref 134–144)
Total Protein: 7 g/dL (ref 6.0–8.5)

## 2014-08-30 LAB — URIC ACID: Uric Acid: 10.1 mg/dL — ABNORMAL HIGH (ref 3.7–8.6)

## 2014-08-30 LAB — FRUCTOSAMINE: Fructosamine: 284 umol/L (ref 0–285)

## 2014-09-02 ENCOUNTER — Other Ambulatory Visit: Payer: Self-pay | Admitting: Family Medicine

## 2014-09-09 ENCOUNTER — Telehealth: Payer: Self-pay | Admitting: Family Medicine

## 2014-09-09 NOTE — Telephone Encounter (Signed)
Would like to know if its okay for him to take hydroxycut (weigt loss supplement)? Also, the antibotic is not working and would like something different called in.

## 2014-09-10 ENCOUNTER — Other Ambulatory Visit: Payer: Self-pay | Admitting: Emergency Medicine

## 2014-09-10 MED ORDER — INDOMETHACIN 50 MG PO CAPS: 50.0000 mg | ORAL_CAPSULE | Freq: Two times a day (BID) | ORAL | Status: DC

## 2014-09-13 ENCOUNTER — Encounter: Payer: Self-pay | Admitting: *Deleted

## 2014-09-13 ENCOUNTER — Other Ambulatory Visit: Payer: Self-pay

## 2014-09-13 ENCOUNTER — Encounter: Payer: Medicare Other | Attending: Family Medicine | Admitting: *Deleted

## 2014-09-13 VITALS — BP 130/62 | Ht 73.0 in | Wt 300.3 lb

## 2014-09-13 DIAGNOSIS — E119 Type 2 diabetes mellitus without complications: Secondary | ICD-10-CM | POA: Diagnosis not present

## 2014-09-13 MED ORDER — FREESTYLE LANCETS MISC: Status: DC

## 2014-09-13 MED ORDER — GLUCOSE BLOOD VI STRP: ORAL_STRIP | Status: DC

## 2014-09-13 MED ORDER — ALLOPURINOL 100 MG PO TABS: 100.0000 mg | ORAL_TABLET | Freq: Two times a day (BID) | ORAL | Status: DC

## 2014-09-13 NOTE — Progress Notes (Signed)
Diabetes Self-Management Education  Visit Type: First/Initial  Appt. Start Time: 0850 Appt. End Time: 1010  09/13/2014  Mr. Mason Johnson, identified by name and date of birth, is a 64 y.o. male with a diagnosis of Diabetes: Type 2.   ASSESSMENT Blood pressure 130/62, height 6\' 1"  (1.854 m), weight 300 lb 4.8 oz (136.215 kg). Body mass index is 39.63 kg/(m^2).      Diabetes Self-Management Education - 09/13/14 1046    Visit Information   Visit Type First/Initial   Initial Visit   Diabetes Type Type 2   Are you currently following a meal plan? Yes   What type of meal plan do you follow? "no fried foods"   Are you taking your medications as prescribed? Yes   Date Diagnosed 08/29/14   Health Coping   How would you rate your overall health? Good   Psychosocial Assessment   Patient Belief/Attitude about Diabetes Other (comment)  "terrible"   Self-care barriers None   Self-management support Family;Doctor's office   Other persons present Spouse/SO   Patient Concerns Nutrition/Meal planning;Monitoring;Weight Control;Glycemic Control;Healthy Lifestyle   Special Needs None   Preferred Learning Style Visual   Learning Readiness Contemplating   How often do you need to have someone help you when you read instructions, pamphlets, or other written materials from your doctor or pharmacy? 1 - Never   What is the last grade level you completed in school? 12 +   Complications   Last HgB A1C per patient/outside source 6.5 %  08/29/14   How often do you check your blood sugar? 0 times/day (not testing)  Provided FreeStyle Freedom Lite meter and instructed on use. BG upon return demonstration was 147 mg/dL at 10:00 am - 2 hrs pp.   Have you had a dilated eye exam in the past 12 months? No   Have you had a dental exam in the past 12 months? No   Are you checking your feet? No   Dietary Intake   Breakfast ham or sausage or bacon with eggs, biscuits or toast, griits or apples   Lunch meat -  hamburger, steak   Dinner chicken, fish, pork chop wtih salads, non-starchy vegetables, occasonal baked potato   Beverage(s) water, juices   Exercise   Exercise Type Light (walking / raking leaves)  sits ups and jumping jacks   How many days per week to you exercise? 5   How many minutes per day do you exercise? 10   Total minutes per week of exercise 50   Patient Education   Previous Diabetes Education No   Disease state  Definition of diabetes, type 1 and 2, and the diagnosis of diabetes;Factors that contribute to the development of diabetes   Nutrition management  Role of diet in the treatment of diabetes and the relationship between the three main macronutrients and blood glucose level   Physical activity and exercise  Role of exercise on diabetes management, blood pressure control and cardiac health.   Monitoring Taught/evaluated SMBG meter.;Purpose and frequency of SMBG.;Identified appropriate SMBG and/or A1C goals.   Chronic complications Relationship between chronic complications and blood glucose control   Psychosocial adjustment Identified and addressed patients feelings and concerns about diabetes   Personal strategies to promote health Lifestyle issues that need to be addressed for better diabetes care  effects of alcohol on diabetes   Outcomes   Expected Outcomes Demonstrated interest in learning. Expect positive outcomes      Individualized Plan for Diabetes Self-Management Training:  Learning Objective:  Patient will have a greater understanding of diabetes self-management. Patient education plan is to attend individual and/or group sessions per assessed needs and concerns.   Plan:   Patient Instructions  Check blood sugars 2 x day before breakfast and 2 hrs after supper 3-4 x week Exercise: Begin stationary bike for 10  minutes  3 days a week and gradually increase to 150 minutes/week Avoid sugar sweetened drinks (juices) Try tomato juice to mix with alcohol (low  salt) Eat 3 meals day,  1-2  snacks a day Space meals 4-6 hours apart Make an eye doctor appointment Bring blood sugar records to the next class Call your doctor for a prescription for:  1. Meter strips (type) FreeStyle Lite checking  3-4 times week  2. Lancets (type) FreeStyle checking  3-4 times week  Expected Outcomes:  Demonstrated interest in learning. Expect positive outcomes  Education material provided:  General Meal Planning Guidelines Meter - FreeStyle Freedom Lite  If problems or questions, patient to contact team via:   Johny Drilling, [place], [place], CDE [phone]  Future DSME appointment:  October 03, 2014 for Class 1

## 2014-09-13 NOTE — Patient Instructions (Addendum)
Check blood sugars 2 x day before breakfast and 2 hrs after supper 3-4 x week Exercise: Begin stationary bike for 10  minutes  3 days a week and gradually increase to 150 minutes/week Avoid sugar sweetened drinks (juices) Try tomato juice to mix with alcohol (low salt) Eat 3 meals day,  1-2  snacks a day Space meals 4-6 hours apart Make a eye doctor appointment Bring blood sugar records to the next class Call your doctor for a prescription for:  1. Meter strips (type) FreeStyle Lite checking  3-4 times week  2. Lancets (type) FreeStyle checking  3-4 times week

## 2014-09-20 ENCOUNTER — Telehealth: Payer: Self-pay | Admitting: *Deleted

## 2014-09-20 NOTE — Telephone Encounter (Signed)
Received call from patient. He had questions about control solution for glucometer. Instructed him that he doesn't have to use unless he thinks the meter is not working. He also reports that his strips would be $70 at pharmacy. Instructed pt to call his insurance plan to see which brand is preferred. He is going to the [place] on Tues to establish care and reports he will wait until then to see if the [place] will give another meter that is covered. He also reports that he doesn't have lancets. Will leave 10 lancets at front desk and 2 bottles of strips for current meter at front for him to pick up. He reports FBG's are less than 130 mg/dL and post meals are less than 160 mg/dL.

## 2014-09-20 NOTE — Telephone Encounter (Signed)
Spoke to patient about Hydroxycut. Will not take medication.

## 2014-10-03 ENCOUNTER — Encounter: Payer: Medicare Other | Attending: Family Medicine | Admitting: Dietician

## 2014-10-03 VITALS — Ht 73.0 in | Wt 295.2 lb

## 2014-10-03 DIAGNOSIS — E119 Type 2 diabetes mellitus without complications: Secondary | ICD-10-CM | POA: Diagnosis not present

## 2014-10-03 NOTE — Progress Notes (Signed)

## 2014-10-08 ENCOUNTER — Telehealth: Payer: Self-pay | Admitting: Family Medicine

## 2014-10-08 NOTE — Telephone Encounter (Signed)
Requesting refill on Indomethacin 50mg . Please send to [facility]. Requesting more than 14pills. And he is completely out. Last seen in July. Please call when complete

## 2014-10-10 ENCOUNTER — Encounter: Payer: Medicare Other | Admitting: *Deleted

## 2014-10-10 ENCOUNTER — Encounter: Payer: Self-pay | Admitting: *Deleted

## 2014-10-10 VITALS — Wt 290.2 lb

## 2014-10-10 DIAGNOSIS — E119 Type 2 diabetes mellitus without complications: Secondary | ICD-10-CM | POA: Diagnosis not present

## 2014-10-10 NOTE — Progress Notes (Signed)

## 2014-10-17 ENCOUNTER — Encounter: Payer: Medicare Other | Admitting: Dietician

## 2014-10-17 ENCOUNTER — Telehealth: Payer: Self-pay | Admitting: Emergency Medicine

## 2014-10-17 VITALS — Ht 73.0 in | Wt 288.4 lb

## 2014-10-17 DIAGNOSIS — E119 Type 2 diabetes mellitus without complications: Secondary | ICD-10-CM

## 2014-10-17 MED ORDER — INDOMETHACIN 50 MG PO CAPS: 50.0000 mg | ORAL_CAPSULE | Freq: Two times a day (BID) | ORAL | Status: DC

## 2014-10-17 NOTE — Telephone Encounter (Signed)
Indomethacin called in for 14 day supply. Patient notified to take Allopurinol everyday so there is no flare ups

## 2014-10-17 NOTE — Progress Notes (Signed)

## 2014-10-22 ENCOUNTER — Encounter: Payer: Self-pay | Admitting: *Deleted

## 2014-10-29 ENCOUNTER — Other Ambulatory Visit: Payer: Self-pay | Admitting: Family Medicine

## 2014-11-03 ENCOUNTER — Other Ambulatory Visit: Payer: Self-pay | Admitting: Family Medicine

## 2014-11-04 ENCOUNTER — Ambulatory Visit (INDEPENDENT_AMBULATORY_CARE_PROVIDER_SITE_OTHER): Payer: Medicare Other | Admitting: Family Medicine

## 2014-11-04 ENCOUNTER — Encounter: Payer: Self-pay | Admitting: Family Medicine

## 2014-11-04 VITALS — BP 140/80 | HR 82 | Temp 98.0°F | Resp 18 | Ht 72.0 in | Wt 288.9 lb

## 2014-11-04 DIAGNOSIS — M1000 Idiopathic gout, unspecified site: Secondary | ICD-10-CM

## 2014-11-04 DIAGNOSIS — E119 Type 2 diabetes mellitus without complications: Secondary | ICD-10-CM | POA: Diagnosis not present

## 2014-11-04 DIAGNOSIS — E669 Obesity, unspecified: Secondary | ICD-10-CM | POA: Diagnosis not present

## 2014-11-04 DIAGNOSIS — Z23 Encounter for immunization: Secondary | ICD-10-CM

## 2014-11-04 DIAGNOSIS — L03317 Cellulitis of buttock: Secondary | ICD-10-CM

## 2014-11-04 DIAGNOSIS — E1169 Type 2 diabetes mellitus with other specified complication: Secondary | ICD-10-CM

## 2014-11-04 MED ORDER — CEPHALEXIN 500 MG PO CAPS: 500.0000 mg | ORAL_CAPSULE | Freq: Four times a day (QID) | ORAL | Status: DC

## 2014-11-04 MED ORDER — PREDNISONE 20 MG PO TABS: 20.0000 mg | ORAL_TABLET | Freq: Two times a day (BID) | ORAL | Status: DC

## 2014-11-05 NOTE — Progress Notes (Signed)
Name: Mason Johnson   MRN: [id number]    DOB: March 26, 1950   Date:11/05/2014       Progress Note  Subjective  Chief Complaint  Chief Complaint  Patient presents with  . Gout    right foot for 1 month    HPI  Gout  Patient's been experiencing recurrent episodes of gout. He has taken colchicine prednisone and indomethacin. It is been suggested in the past with uricosuric agent. Thus far. He's had what sounds to be at least 6 or 8 episodes in the past one year. He now is recovering from a 3 to four-week history of pain and redness and swelling in the right great toe as well as the dorsum right foot and right knee. They have improved his a great degree with colchicine and prednisone.  Cellulitis of the buttocks  Patient complains of recurrent sores on his rectal area. He does sweat quite a bit. He has had similar episodes in the past.  Diabetes patient is also diabetic. He is currently being managed by diet alone and exercise his report having refused to medications and A1c was 6.5 on 08/29/2014  Past Medical History  Diagnosis Date  . Hypertension   . Sleep apnea     cpap occasionally  . Arthritis   . Glaucoma (increased eye pressure)   . Diabetes mellitus without complication ([place])   . Hyperlipidemia     Social History  Substance Use Topics  . Smoking status: Former Smoker -- 1.50 packs/day for 50 years    Types: Cigarettes    Quit date: 02/11/2013  . Smokeless tobacco: Never Used  . Alcohol Use: 6.0 oz/week    3 Cans of beer, 7 Shots of liquor per week     Comment: daily cocktails 3-4     Current outpatient prescriptions:  .  allopurinol (ZYLOPRIM) 100 MG tablet, Take 1 tablet (100 mg total) by mouth 2 (two) times daily., Disp: 60 tablet, Rfl: 2 .  aspirin EC 81 MG tablet, Take 81 mg by mouth daily., Disp: , Rfl:  .  atorvastatin (LIPITOR) 10 MG tablet, Take 10 mg by mouth daily., Disp: , Rfl:  .  BYSTOLIC 10 MG tablet, TAKE 1 TABLET DAILY (NEED APPOINTMENT BEFORE  NEXT REFILL), Disp: 90 tablet, Rfl: 4 .  cephALEXin (KEFLEX) 500 MG capsule, Take 1 capsule (500 mg total) by mouth 4 (four) times daily., Disp: 40 capsule, Rfl: 0 .  CIALIS 10 MG tablet, TAKE 1 TABLET DAILY AS NEEDED, Disp: 18 tablet, Rfl: 3 .  fluticasone (FLONASE) 50 MCG/ACT nasal spray, , Disp: , Rfl:  .  glucose blood test strip, Use as instructed, Disp: 100 each, Rfl: 12 .  indomethacin (INDOCIN) 50 MG capsule, Take 1 capsule (50 mg total) by mouth 2 (two) times daily with a meal., Disp: 12 capsule, Rfl: 0 .  Lancets (FREESTYLE) lancets, Use as instructed, Disp: 100 each, Rfl: 12 .  losartan-hydrochlorothiazide (HYZAAR) 100-25 MG tablet, TAKE 1 TABLET DAILY, Disp: 90 tablet, Rfl: 1 .  predniSONE (DELTASONE) 20 MG tablet, Take 1 tablet (20 mg total) by mouth 2 (two) times daily with a meal., Disp: 10 tablet, Rfl: 0 .  timolol (BETIMOL) 0.5 % ophthalmic solution, Place 1 drop into both eyes daily as needed. , Disp: , Rfl:  .  triamcinolone (NASACORT ALLERGY 24HR) 55 MCG/ACT AERO nasal inhaler, Place 2 sprays into the nose daily., Disp: , Rfl:   No Known Allergies  Review of Systems  Constitutional: Positive for malaise/fatigue.  Negative for fever, chills and weight loss.  HENT: Negative for congestion, hearing loss, sore throat and tinnitus.   Eyes: Negative for blurred vision, double vision and redness.  Respiratory: Negative for cough, hemoptysis and shortness of breath.   Cardiovascular: Negative for chest pain, palpitations, orthopnea, claudication and leg swelling.  Gastrointestinal: Negative for heartburn, nausea, vomiting, diarrhea, constipation and blood in stool.  Genitourinary: Negative for dysuria, urgency, frequency and hematuria.       Erectile dysfunction  Musculoskeletal: Positive for back pain. Negative for myalgias, joint pain, falls and neck pain.  Skin: Positive for itching and rash.  Neurological: Negative for dizziness, tingling, tremors, focal weakness, seizures,  loss of consciousness, weakness and headaches.  Endo/Heme/Allergies: Does not bruise/bleed easily.  Psychiatric/Behavioral: Negative for depression and substance abuse. The patient is not nervous/anxious and does not have insomnia.      Objective  Filed Vitals:   11/04/14 0804  BP: 140/80  Pulse: 82  Temp: 98 F (36.7 C)  TempSrc: Oral  Resp: 18  Height: 6' (1.829 m)  Weight: 288 lb 14.4 oz (131.044 kg)  SpO2: 94%     Physical Exam  Constitutional: He is oriented to person, place, and time and well-developed, well-nourished, and in no distress.  Obese male is in no acute distress  HENT:  Head: Normocephalic.  Eyes: EOM are normal. Pupils are equal, round, and reactive to light.  Neck: Normal range of motion. Neck supple. No thyromegaly present.  Cardiovascular: Normal rate, regular rhythm and normal heart sounds.   No murmur heard. Pulmonary/Chest: Effort normal and breath sounds normal. No respiratory distress. He has no wheezes.  Abdominal: Soft. Bowel sounds are normal.  Musculoskeletal: He exhibits no edema.  Dull tender slightly warm to touch with first TM joint as well as the dorsum of the foot.  Lymphadenopathy:    He has no cervical adenopathy.  Neurological: He is alert and oriented to person, place, and time. No cranial nerve deficit. Gait normal. Coordination normal.  Skin: No rash noted.  Multiple boils are noted on the buttocks and perirectal area. None are actively draining. There is tenderness to palpation.  Psychiatric: Affect and judgment normal.      Assessment & Plan  1. Idiopathic gout, unspecified chronicity, unspecified site Uric acid level and prednisone 20 mg twice a day for 5 days  2. Diabetes mellitus type 2 in obese (HCC) Diet and exercise again encouraged  3. Cellulitis of buttock Treatment with cephalexin - cephALEXin (KEFLEX) 500 MG capsule; Take 1 capsule (500 mg total) by mouth 4 (four) times daily.  Dispense: 40 capsule; Refill:  0  4. Need for influenza vaccination Given today - Flu vaccine HIGH DOSE PF (Fluzone High dose)

## 2014-11-13 ENCOUNTER — Telehealth: Payer: Self-pay | Admitting: Family Medicine

## 2014-11-13 NOTE — Telephone Encounter (Signed)
NEEDS REFILL ON INDOMETHACIN 75MG  (GOUT MEDS) SAID THAT HE ALSO PUT HIM ON ANOTHER MEDICATION TO GO WITH THE OTHER ONE BUT NOTHING IS DOING ANY GOOD. PLEASE ADVISE. HE IS HAVING A LOT OF PAIN AND IT DOES NOT SEEM TO BE GOING AWAY.PHARM IS [facility] [place].   PT SAID TO LET YOU ALL KNOW THAT HE NEEDS HIS PHARM CHANGED IN THE SYSTEM TO [facility] IN [place] NOT [place].

## 2014-11-14 MED ORDER — INDOMETHACIN 50 MG PO CAPS: 50.0000 mg | ORAL_CAPSULE | Freq: Two times a day (BID) | ORAL | Status: DC

## 2014-11-14 NOTE — Telephone Encounter (Signed)
PT NOTIFIED  

## 2014-11-14 NOTE — Telephone Encounter (Signed)
Sent medication to [facility]

## 2014-11-14 NOTE — Telephone Encounter (Signed)
Renew the indomethacin but did not stop the medication

## 2015-01-24 ENCOUNTER — Other Ambulatory Visit: Payer: Self-pay | Admitting: Family Medicine

## 2015-02-04 ENCOUNTER — Encounter: Payer: Self-pay | Admitting: Family Medicine

## 2015-02-04 ENCOUNTER — Ambulatory Visit (INDEPENDENT_AMBULATORY_CARE_PROVIDER_SITE_OTHER): Payer: Medicare Other | Admitting: Family Medicine

## 2015-02-04 VITALS — BP 140/70 | HR 88 | Temp 98.9°F | Resp 18 | Ht 72.0 in | Wt 281.8 lb

## 2015-02-04 DIAGNOSIS — L03221 Cellulitis of neck: Secondary | ICD-10-CM

## 2015-02-04 DIAGNOSIS — L0211 Cutaneous abscess of neck: Secondary | ICD-10-CM

## 2015-02-04 NOTE — Progress Notes (Signed)
Name: Mason Johnson   MRN: [id number]    DOB: [birth year]   Date:02/04/2015       Progress Note  Subjective  Chief Complaint  Chief Complaint  Patient presents with  . Insect Bite    Possible insect bite on right side of neck, red, swollen, odor, oozing for 4 days    HPI  Patient presents with a knot on the right side of his neck which he says is been present for about 4 days. He thinks it begins as a small insect bite but has begun to fester and draining. He's had no fever or chills. Is not diabetic.  Past Medical History  Diagnosis Date  . Hypertension   . Sleep apnea     cpap occasionally  . Arthritis   . Glaucoma (increased eye pressure)   . Diabetes mellitus without complication ([place])   . Hyperlipidemia     Social History  Substance Use Topics  . Smoking status: Former Smoker -- 1.50 packs/day for 50 years    Types: Cigarettes    Quit date: 02/11/2013  . Smokeless tobacco: Never Used  . Alcohol Use: 6.0 oz/week    3 Cans of beer, 7 Shots of liquor per week     Comment: daily cocktails 3-4     Current outpatient prescriptions:  .  allopurinol (ZYLOPRIM) 100 MG tablet, Take 1 tablet (100 mg total) by mouth 2 (two) times daily., Disp: 60 tablet, Rfl: 2 .  aspirin EC 81 MG tablet, Take 81 mg by mouth daily., Disp: , Rfl:  .  atorvastatin (LIPITOR) 10 MG tablet, Take 10 mg by mouth daily., Disp: , Rfl:  .  BYSTOLIC 10 MG tablet, TAKE 1 TABLET DAILY (NEED APPOINTMENT BEFORE NEXT REFILL), Disp: 90 tablet, Rfl: 4 .  cephALEXin (KEFLEX) 500 MG capsule, Take 1 capsule (500 mg total) by mouth 4 (four) times daily., Disp: 40 capsule, Rfl: 0 .  CIALIS 10 MG tablet, TAKE 1 TABLET DAILY AS NEEDED, Disp: 18 tablet, Rfl: 3 .  fluticasone (FLONASE) 50 MCG/ACT nasal spray, USE 1 SPRAY NASALLY TWICE A DAY, Disp: 48 g, Rfl: 2 .  glucose blood test strip, Use as instructed, Disp: 100 each, Rfl: 12 .  indomethacin (INDOCIN) 50 MG capsule, Take 1 capsule (50 mg total) by mouth 2 (two)  times daily with a meal., Disp: 12 capsule, Rfl: 0 .  Lancets (FREESTYLE) lancets, Use as instructed, Disp: 100 each, Rfl: 12 .  losartan-hydrochlorothiazide (HYZAAR) 100-25 MG tablet, TAKE 1 TABLET DAILY, Disp: 90 tablet, Rfl: 1 .  predniSONE (DELTASONE) 20 MG tablet, Take 1 tablet (20 mg total) by mouth 2 (two) times daily with a meal., Disp: 10 tablet, Rfl: 0 .  timolol (BETIMOL) 0.5 % ophthalmic solution, Place 1 drop into both eyes daily as needed. , Disp: , Rfl:  .  triamcinolone (NASACORT ALLERGY 24HR) 55 MCG/ACT AERO nasal inhaler, Place 2 sprays into the nose daily., Disp: , Rfl:   No Known Allergies  Review of Systems  Constitutional: Negative for fever, chills and weight loss.  HENT: Negative for congestion, hearing loss, sore throat and tinnitus.   Eyes: Negative for blurred vision, double vision and redness.  Respiratory: Negative for cough, hemoptysis and shortness of breath.   Cardiovascular: Negative for chest pain, palpitations, orthopnea, claudication and leg swelling.  Gastrointestinal: Negative for heartburn, nausea, vomiting, diarrhea, constipation and blood in stool.  Genitourinary: Negative for dysuria, urgency, frequency and hematuria.  Musculoskeletal: Negative for myalgias, back pain, joint  pain, falls and neck pain.  Skin: Positive for rash. Negative for itching.  Neurological: Negative for dizziness, tingling, tremors, focal weakness, seizures, loss of consciousness (no pharyngeal lesions.), weakness and headaches.  Endo/Heme/Allergies: Does not bruise/bleed easily.  Psychiatric/Behavioral: Negative for depression and substance abuse. The patient is not nervous/anxious and does not have insomnia.      Objective  Filed Vitals:   02/04/15 0922  BP: 140/70  Pulse: 88  Temp: 98.9 F (37.2 C)  TempSrc: Oral  Resp: 18  Height: 6' (1.829 m)  Weight: 281 lb 12.8 oz (127.824 kg)  SpO2: 94%     Physical Exam  Constitutional: He is oriented to person, place,  and time.  Obesity no acute distress  HENT:  4 cm indurated abscess the right submandibular area with draining pustules looking consistent with MRSA. Underlying cervical nodes are tender no oral lesions are noted  Eyes: EOM are normal. Pupils are equal, round, and reactive to light.  Neck: Normal range of motion. Neck supple. No thyromegaly present.  Cardiovascular: Normal rate, regular rhythm and normal heart sounds.   No murmur heard. Pulmonary/Chest: Effort normal and breath sounds normal. No respiratory distress. He has no wheezes.  Abdominal: Soft. Bowel sounds are normal.  Musculoskeletal: Normal range of motion. He exhibits no edema.  Lymphadenopathy:    He has no cervical adenopathy.  Neurological: He is alert and oriented to person, place, and time. No cranial nerve deficit. Gait normal. Coordination normal.  Skin: Skin is dry. No rash noted.  see HEENT  Psychiatric: Affect and judgment normal.      Assessment & Plan  1. Neck abscess  - Ambulatory referral to ENT - Wound culture  2. Cellulitis and abscess of neck Rocephin and trimethoprim sulfamethoxazole as were ready but the ENT was able see him today

## 2015-02-06 ENCOUNTER — Telehealth: Payer: Self-pay | Admitting: Emergency Medicine

## 2015-02-06 DIAGNOSIS — L0211 Cutaneous abscess of neck: Secondary | ICD-10-CM | POA: Diagnosis not present

## 2015-02-06 LAB — WOUND CULTURE

## 2015-02-06 LAB — PLEASE NOTE

## 2015-02-06 NOTE — Telephone Encounter (Signed)
Patient seen ENT. Lab sent for follow appointment

## 2015-02-07 ENCOUNTER — Telehealth: Payer: Self-pay | Admitting: Emergency Medicine

## 2015-02-07 NOTE — Telephone Encounter (Signed)
Labs faxed to ENT

## 2015-02-07 NOTE — Progress Notes (Signed)
Copy sent to ENT Dr. Pryor Ochoa

## 2015-03-13 DIAGNOSIS — R221 Localized swelling, mass and lump, neck: Secondary | ICD-10-CM | POA: Diagnosis not present

## 2015-03-13 DIAGNOSIS — L728 Other follicular cysts of the skin and subcutaneous tissue: Secondary | ICD-10-CM | POA: Diagnosis not present

## 2015-03-13 DIAGNOSIS — L910 Hypertrophic scar: Secondary | ICD-10-CM | POA: Diagnosis not present

## 2015-03-13 DIAGNOSIS — L0211 Cutaneous abscess of neck: Secondary | ICD-10-CM | POA: Diagnosis not present

## 2015-03-13 DIAGNOSIS — L729 Follicular cyst of the skin and subcutaneous tissue, unspecified: Secondary | ICD-10-CM | POA: Diagnosis not present

## 2015-05-12 ENCOUNTER — Telehealth: Payer: Self-pay | Admitting: Family Medicine

## 2015-05-12 NOTE — Telephone Encounter (Signed)
Requesting refill on both blood pressure medications. Please send to [facility].

## 2015-05-13 MED ORDER — NEBIVOLOL HCL 10 MG PO TABS: ORAL_TABLET | ORAL | Status: DC

## 2015-05-13 MED ORDER — LOSARTAN POTASSIUM-HCTZ 100-25 MG PO TABS: 1.0000 | ORAL_TABLET | Freq: Every day | ORAL | Status: DC

## 2015-05-23 ENCOUNTER — Encounter: Payer: Self-pay | Admitting: *Deleted

## 2016-01-07 ENCOUNTER — Ambulatory Visit (INDEPENDENT_AMBULATORY_CARE_PROVIDER_SITE_OTHER): Payer: Medicare Other

## 2016-01-07 DIAGNOSIS — Z23 Encounter for immunization: Secondary | ICD-10-CM

## 2016-03-15 DIAGNOSIS — G8929 Other chronic pain: Secondary | ICD-10-CM | POA: Diagnosis not present

## 2016-03-15 DIAGNOSIS — M48062 Spinal stenosis, lumbar region with neurogenic claudication: Secondary | ICD-10-CM | POA: Diagnosis not present

## 2016-03-15 DIAGNOSIS — M4316 Spondylolisthesis, lumbar region: Secondary | ICD-10-CM | POA: Diagnosis not present

## 2016-03-15 DIAGNOSIS — M545 Low back pain: Secondary | ICD-10-CM | POA: Diagnosis not present

## 2016-04-27 ENCOUNTER — Encounter: Payer: Self-pay | Admitting: Family Medicine

## 2016-04-27 ENCOUNTER — Ambulatory Visit (INDEPENDENT_AMBULATORY_CARE_PROVIDER_SITE_OTHER): Payer: Medicare Other | Admitting: Family Medicine

## 2016-04-27 VITALS — BP 104/60 | HR 97 | Temp 98.7°F | Resp 16 | Wt 272.8 lb

## 2016-04-27 DIAGNOSIS — R221 Localized swelling, mass and lump, neck: Secondary | ICD-10-CM

## 2016-04-27 DIAGNOSIS — J0100 Acute maxillary sinusitis, unspecified: Secondary | ICD-10-CM

## 2016-04-27 MED ORDER — AMOXICILLIN-POT CLAVULANATE 875-125 MG PO TABS: 1.0000 | ORAL_TABLET | Freq: Two times a day (BID) | ORAL | 0 refills | Status: AC

## 2016-04-27 NOTE — Progress Notes (Signed)
BP 104/60   Pulse 97   Temp 98.7 F (37.1 C) (Oral)   Resp 16   Wt 272 lb 12.8 oz (123.7 kg)   SpO2 93%   BMI 37.00 kg/m    Subjective:    Patient ID: Mason Johnson, male    DOB: 1950/08/07, 66 y.o.   MRN: [id number]  HPI: Mason Johnson is a 66 y.o. male  Chief Complaint  Patient presents with  . URI    onset 4 days symptoms include runny itchy eyes, tired and cough   Patient is here for an acute infection/illness; he is new to me Patient has been bothered for 4 days Both sides No teeth pain Ears itching No bad sore throat No rash No travel Using CPAP; mouth is really dry No known drug allergies  Swelling above both clavicles; 4 years duration; no night sweats; no unexplained weight loss  Depression screen [facility] 2/9 04/27/2016 02/04/2015 11/04/2014 09/13/2014 08/29/2014  Decreased Interest 0 0 0 0 0  Down, Depressed, Hopeless 0 0 0 0 0  PHQ - 2 Score 0 0 0 0 0   Relevant past medical, surgical, family and social history reviewed Past Medical History:  Diagnosis Date  . Arthritis   . Diabetes mellitus without complication ([place])   . Glaucoma (increased eye pressure)   . Hyperlipidemia   . Hypertension   . Sleep apnea    cpap occasionally   Past Surgical History:  Procedure Laterality Date  . APPENDECTOMY    . LUMBAR LAMINECTOMY/DECOMPRESSION MICRODISCECTOMY N/A 06/19/2013   Procedure: LUMBAR LAMINECTOMY/DECOMPRESSION MICRODISCECTOMY 3 LEVELS  lumbar two/three, three/four, four/five;  Surgeon: Consuella Lose, MD;  Location: [facility];  Service: Neurosurgery;  Laterality: N/A;  . TONSILLECTOMY     Family History  Problem Relation Age of Onset  . Diabetes Sister   . Diabetes Sister   . Diabetes Sister   . Diabetes Sister   . Hypertension Mother   . Heart disease Mother   . Heart failure Mother   . Stroke Brother   . Heart failure Brother   . Heart disease Brother    Social History  Substance Use Topics  . Smoking status: Former Smoker   Packs/day: 1.50    Years: 50.00    Types: Cigarettes    Quit date: 02/11/2013  . Smokeless tobacco: Never Used  . Alcohol use 6.0 oz/week    3 Cans of beer, 7 Shots of liquor per week     Comment: daily cocktails 3-4   Interim medical history since last visit reviewed. Allergies and medications reviewed  Review of Systems Per HPI unless specifically indicated above     Objective:    BP 104/60   Pulse 97   Temp 98.7 F (37.1 C) (Oral)   Resp 16   Wt 272 lb 12.8 oz (123.7 kg)   SpO2 93%   BMI 37.00 kg/m   Wt Readings from Last 3 Encounters:  04/27/16 272 lb 12.8 oz (123.7 kg)  02/04/15 281 lb 12.8 oz (127.8 kg)  11/04/14 288 lb 14.4 oz (131 kg)    Physical Exam  Constitutional: He appears well-developed and well-nourished. No distress.  HENT:  Right Ear: Hearing, tympanic membrane, external ear and ear canal normal.  Left Ear: Hearing, tympanic membrane, external ear and ear canal normal.  Nose: Mucosal edema and rhinorrhea present. Right sinus exhibits maxillary sinus tenderness. Left sinus exhibits maxillary sinus tenderness.  Mouth/Throat: Oropharynx is clear and moist.  Eyes: No  scleral icterus.  Cardiovascular: Normal rate.   Pulmonary/Chest: Effort normal.  Lymphadenopathy:       Head (right side): No posterior auricular adenopathy present.       Head (left side): No posterior auricular adenopathy present.    He has no cervical adenopathy.  Supraclavicular swelling bilaterally, consistency similar to fat pads; no discrete nodules  Neurological: He is alert.  Skin: He is not diaphoretic. No pallor.  Psychiatric: He has a normal mood and affect.    Results for orders placed or performed in visit on 02/04/15  Wound culture  Result Value Ref Range   Aerobic Bacterial Culture Final report (A)    Result 1 Staphylococcus aureus (A)    ANTIMICROBIAL SUSCEPTIBILITY Comment   Please Note  Result Value Ref Range   Please note Comment       Assessment & Plan:    Problem List Items Addressed This Visit      Other   Localized swelling, mass and lump, neck    Order US of the neck soft tissue; may be supraclavicular fat pads, but really large; no night sweats      Relevant Orders   US Soft Tissue Head/Neck (Completed)    Other Visit Diagnoses    Acute non-recurrent maxillary sinusitis    -  Primary   antibiotics, C diff precautions, rest, hydration       Follow up plan: No Follow-up on file.  An after-visit summary was printed and given to the patient at [facility].  Please see the patient instructions which may contain other information and recommendations beyond what is mentioned above in the assessment and plan.  Meds ordered this encounter  Medications  . nebivolol (BYSTOLIC) 10 MG tablet    Sig: Take 10 mg by mouth daily.  Marland Kitchen amoxicillin-clavulanate (AUGMENTIN) 875-125 MG tablet    Sig: Take 1 tablet by mouth 2 (two) times daily.    Dispense:  20 tablet    Refill:  0    Orders Placed This Encounter  Procedures  . US Soft Tissue Head/Neck

## 2016-04-27 NOTE — Patient Instructions (Signed)
We'll get the [place] of your neck Start the antibiotics Please do eat yogurt daily or take a probiotic daily for the next 3-4 weeks We want to replace the healthy germs in the gut If you notice foul, watery diarrhea in the next two months, schedule an appointment RIGHT AWAY Call me if needed

## 2016-04-27 NOTE — Assessment & Plan Note (Signed)
Order US of the neck soft tissue; may be supraclavicular fat pads, but really large; no night sweats

## 2016-05-04 ENCOUNTER — Ambulatory Visit
Admission: RE | Admit: 2016-05-04 | Discharge: 2016-05-04 | Disposition: A | Discharge status: Home / Self Care | Payer: Medicare Other | Source: Ambulatory Visit | Attending: Family Medicine | Admitting: Family Medicine

## 2016-05-04 DIAGNOSIS — R221 Localized swelling, mass and lump, neck: Secondary | ICD-10-CM | POA: Diagnosis present

## 2017-11-25 ENCOUNTER — Encounter: Payer: Self-pay | Admitting: Nurse Practitioner

## 2017-11-25 ENCOUNTER — Ambulatory Visit (INDEPENDENT_AMBULATORY_CARE_PROVIDER_SITE_OTHER): Payer: Medicare Other | Admitting: Nurse Practitioner

## 2017-11-25 VITALS — BP 110/80 | HR 72 | Temp 98.0°F | Resp 16 | Ht 72.0 in | Wt 261.1 lb

## 2017-11-25 DIAGNOSIS — J0110 Acute frontal sinusitis, unspecified: Secondary | ICD-10-CM | POA: Diagnosis not present

## 2017-11-25 DIAGNOSIS — M1610 Unilateral primary osteoarthritis, unspecified hip: Secondary | ICD-10-CM | POA: Insufficient documentation

## 2017-11-25 DIAGNOSIS — R05 Cough: Secondary | ICD-10-CM | POA: Diagnosis not present

## 2017-11-25 DIAGNOSIS — M6530 Trigger finger, unspecified finger: Secondary | ICD-10-CM | POA: Insufficient documentation

## 2017-11-25 DIAGNOSIS — R059 Cough, unspecified: Secondary | ICD-10-CM

## 2017-11-25 MED ORDER — GUAIFENESIN 200 MG PO TABS: 200.0000 mg | ORAL_TABLET | Freq: Two times a day (BID) | ORAL | 0 refills | Status: DC

## 2017-11-25 MED ORDER — DOXYCYCLINE HYCLATE 100 MG PO TABS: 100.0000 mg | ORAL_TABLET | Freq: Two times a day (BID) | ORAL | 0 refills | Status: DC

## 2017-11-25 MED ORDER — BENZONATATE 100 MG PO CAPS: 200.0000 mg | ORAL_CAPSULE | Freq: Two times a day (BID) | ORAL | 0 refills | Status: DC

## 2017-11-25 NOTE — Progress Notes (Signed)
Name: Mason Johnson   MRN: [id number]    DOB: [birth year]   Date:11/25/2017       Progress Note  Subjective  Chief Complaint  Chief Complaint  Patient presents with  . Sinusitis    coughing and nasal congestion x 3 weeks    HPI  Patient presents with 3 weeks of fatigue, sinus pressure that gives him severe fatigue. Patient has tried OTC sinus-aid, nyquil, musinex. Patient states cough started a few days after was dry and now very productive cough- copious amounts of phelgm. States felt like he does feel like it is getting better though, cough.    Patient Active Problem List   Diagnosis Date Noted  . Localized swelling, mass and lump, neck 04/27/2016  . HLD (hyperlipidemia) 08/29/2014  . BP (high blood pressure) 08/29/2014  . Lumbar spinal stenosis 06/19/2013    Past Medical History:  Diagnosis Date  . Arthritis   . Diabetes mellitus without complication ([place])   . Glaucoma (increased eye pressure)   . Hyperlipidemia   . Hypertension   . Sleep apnea    cpap occasionally    Past Surgical History:  Procedure Laterality Date  . APPENDECTOMY    . LUMBAR LAMINECTOMY/DECOMPRESSION MICRODISCECTOMY N/A 06/19/2013   Procedure: LUMBAR LAMINECTOMY/DECOMPRESSION MICRODISCECTOMY 3 LEVELS  lumbar two/three, three/four, four/five;  Surgeon: Consuella Lose, MD;  Location: [facility];  Service: Neurosurgery;  Laterality: N/A;  . TONSILLECTOMY      Social History   Tobacco Use  . Smoking status: Former Smoker    Packs/day: 1.50    Years: 50.00    Pack years: 75.00    Types: Cigarettes    Last attempt to quit: 02/11/2013    Years since quitting: 4.7  . Smokeless tobacco: Never Used  Substance Use Topics  . Alcohol use: Yes    Alcohol/week: 10.0 standard drinks    Types: 3 Cans of beer, 7 Shots of liquor per week    Comment: daily cocktails 3-4     Current Outpatient Medications:  .  allopurinol (ZYLOPRIM) 100 MG tablet, Take 1 tablet (100 mg total) by mouth 2 (two) times  daily. (Patient taking differently: Take 100 mg by mouth daily. ), Disp: 60 tablet, Rfl: 2 .  aspirin EC 81 MG tablet, Take 81 mg by mouth daily., Disp: , Rfl:  .  atorvastatin (LIPITOR) 10 MG tablet, Take 10 mg by mouth daily., Disp: , Rfl:  .  glucose blood test strip, Use as instructed, Disp: 100 each, Rfl: 12 .  Lancets (FREESTYLE) lancets, Use as instructed, Disp: 100 each, Rfl: 12 .  losartan-hydrochlorothiazide (HYZAAR) 100-25 MG tablet, Take 1 tablet by mouth daily., Disp: 90 tablet, Rfl: 0 .  nebivolol (BYSTOLIC) 10 MG tablet, Take 10 mg by mouth daily., Disp: , Rfl:  .  timolol (BETIMOL) 0.5 % ophthalmic solution, Place 1 drop into both eyes daily as needed. , Disp: , Rfl:  .  triamcinolone (NASACORT ALLERGY 24HR) 55 MCG/ACT AERO nasal inhaler, Place 2 sprays into the nose daily., Disp: , Rfl:   No Known Allergies  Review of Systems  Constitutional: Positive for malaise/fatigue. Negative for chills and fever.  HENT: Positive for congestion and sinus pain. Negative for ear discharge, sore throat and tinnitus.   Respiratory: Positive for cough and sputum production. Negative for hemoptysis and shortness of breath.   Cardiovascular: Negative for chest pain and palpitations.  Gastrointestinal: Negative for abdominal pain, nausea and vomiting.  Musculoskeletal: Negative for myalgias.  Skin: Negative  for rash.  Neurological: Positive for headaches (mild intermittent). Negative for dizziness.     No other specific complaints in a complete review of systems (except as listed in HPI above).  Objective  Vitals:   11/25/17 0851  BP: 110/80  Pulse: 72  Resp: 16  Temp: 98 F (36.7 C)  TempSrc: Oral  SpO2: 98%  Weight: 261 lb 1.6 oz (118.4 kg)  Height: 6' (1.829 m)     Body mass index is 35.41 kg/m.  Nursing Note and Vital Signs reviewed.  Physical Exam  HENT:  Head: Normocephalic and atraumatic.  Right Ear: External ear normal.  Left Ear: External ear normal.  Nose:  Mucosal edema and sinus tenderness present. Right sinus exhibits no maxillary sinus tenderness and no frontal sinus tenderness. Left sinus exhibits no maxillary sinus tenderness and no frontal sinus tenderness.  Mouth/Throat: Oropharynx is clear and moist and mucous membranes are normal. No oropharyngeal exudate.  Eyes: Conjunctivae are normal. Right eye exhibits no discharge. Left eye exhibits no discharge.  Neck: Normal range of motion.  Cardiovascular: Normal rate and regular rhythm.  Pulmonary/Chest: Effort normal and breath sounds normal.  Abdominal: Soft. There is no tenderness.  Lymphadenopathy:    He has no cervical adenopathy.  Neurological: He is alert.  Skin: Skin is warm and dry. No rash noted.  Psychiatric: Judgment normal.       No results found for this or any previous visit (from the past 48 hour(s)).  Assessment & Plan  1. Acute non-recurrent frontal sinusitis Patient symptoms seem to be improving discussed likely viral etiology, shared decision-making utilized to have delayed antibiotic prescribing.  If patient symptoms have acutely worsened or are unimproved in 2 days will fill doxycycline.  Symptoms have been ongoing for greater than 3 weeks this seems appropriate.  Discussed over-the-counter treatments and red flag signs and symptoms. - doxycycline (VIBRA-TABS) 100 MG tablet; Take 1 tablet (100 mg total) by mouth 2 (two) times daily.  Dispense: 20 tablet; Refill: 0  2. Cough  - benzonatate (TESSALON PERLES) 100 MG capsule; Take 2 capsules (200 mg total) by mouth 2 (two) times daily.  Dispense: 20 capsule; Refill: 0 - guaiFENesin 200 MG tablet; Take 1 tablet (200 mg total) by mouth 2 (two) times daily.  Dispense: 30 tablet; Refill: 0

## 2017-11-25 NOTE — Patient Instructions (Addendum)
-   Please take musinex (guafenesin) and the tessalon perls every 12 hours for the next 3 days and then just as needed. Use neti pot to clear out nasal congestion. Drink plenty of water, rest.  - If you are not feeling better than you do today in 2 days please pick up the antibiotic and complete the entire course  - If at any point you are feeling worse please let us know immediately - If you develop chest pain or shortness of breath please get immediate medical attention  ____________________ You likely have a viral upper respiratory infection (URI). Antibiotics will not reduce the number of days you are ill or prevent you from getting bacterial rhinosinusitis.  Your body is so smart and strong that it will be fighting this illness off for you but it is important that you drink plenty of fluids, rest. Cover your nose/mouth when you cough or sneeze and wash your hands well and often. Here are some helpful things you can use or pick up over the counter from the pharmacy to help with your symptoms:   For Fever/Pain: Acetaminophen every 6 hours as needed (maximum of 3000mg  a day). If you are still uncomfortable you can add ibuprofen OR naproxen  For coughing: try dextromethorphan for a cough suppressant, and/or a cool mist humidifier, lozenges  For sore throat: saline gargles, honey herbal tea, lozenges, throat spray  To dry out your nose: try an antihistamine like loratadine (non-sedating) or diphenhydramine (sedating) or others To relieve a stuffy nose: try flonase, neti pot To make blowing your nose easier: guaifenesin

## 2018-11-01 DIAGNOSIS — Z23 Encounter for immunization: Secondary | ICD-10-CM | POA: Diagnosis not present

## 2018-12-01 DIAGNOSIS — I10 Essential (primary) hypertension: Secondary | ICD-10-CM | POA: Diagnosis not present

## 2018-12-01 DIAGNOSIS — E785 Hyperlipidemia, unspecified: Secondary | ICD-10-CM | POA: Diagnosis not present

## 2018-12-01 DIAGNOSIS — J189 Pneumonia, unspecified organism: Secondary | ICD-10-CM | POA: Diagnosis not present

## 2018-12-01 DIAGNOSIS — F17200 Nicotine dependence, unspecified, uncomplicated: Secondary | ICD-10-CM | POA: Diagnosis present

## 2018-12-01 DIAGNOSIS — I5031 Acute diastolic (congestive) heart failure: Secondary | ICD-10-CM | POA: Diagnosis not present

## 2018-12-01 DIAGNOSIS — G4733 Obstructive sleep apnea (adult) (pediatric): Secondary | ICD-10-CM | POA: Diagnosis present

## 2018-12-01 DIAGNOSIS — R779 Abnormality of plasma protein, unspecified: Secondary | ICD-10-CM | POA: Diagnosis not present

## 2018-12-01 DIAGNOSIS — R0602 Shortness of breath: Secondary | ICD-10-CM | POA: Diagnosis not present

## 2018-12-01 DIAGNOSIS — J1289 Other viral pneumonia: Secondary | ICD-10-CM | POA: Diagnosis not present

## 2018-12-01 DIAGNOSIS — Z6825 Body mass index (BMI) 25.0-25.9, adult: Secondary | ICD-10-CM | POA: Diagnosis not present

## 2018-12-01 DIAGNOSIS — Z20828 Contact with and (suspected) exposure to other viral communicable diseases: Secondary | ICD-10-CM | POA: Diagnosis not present

## 2018-12-01 DIAGNOSIS — M109 Gout, unspecified: Secondary | ICD-10-CM | POA: Diagnosis present

## 2018-12-01 DIAGNOSIS — I5021 Acute systolic (congestive) heart failure: Secondary | ICD-10-CM | POA: Diagnosis not present

## 2018-12-01 DIAGNOSIS — R600 Localized edema: Secondary | ICD-10-CM | POA: Diagnosis not present

## 2018-12-01 DIAGNOSIS — I440 Atrioventricular block, first degree: Secondary | ICD-10-CM | POA: Diagnosis not present

## 2018-12-01 DIAGNOSIS — I2510 Atherosclerotic heart disease of native coronary artery without angina pectoris: Secondary | ICD-10-CM | POA: Diagnosis not present

## 2018-12-01 DIAGNOSIS — E669 Obesity, unspecified: Secondary | ICD-10-CM | POA: Diagnosis present

## 2018-12-01 DIAGNOSIS — I110 Hypertensive heart disease with heart failure: Secondary | ICD-10-CM | POA: Diagnosis not present

## 2018-12-01 DIAGNOSIS — I509 Heart failure, unspecified: Secondary | ICD-10-CM | POA: Diagnosis not present

## 2019-12-26 ENCOUNTER — Inpatient Hospital Stay
Admission: EM | Admit: 2019-12-26 | Discharge: 2020-01-02 | DRG: 545 | Disposition: E | Payer: Medicare Other | Attending: Internal Medicine | Admitting: Internal Medicine

## 2019-12-26 ENCOUNTER — Emergency Department: Payer: Medicare Other

## 2019-12-26 ENCOUNTER — Inpatient Hospital Stay (HOSPITAL_COMMUNITY)
Admit: 2019-12-26 | Discharge: 2019-12-26 | Disposition: A | Discharge status: Home / Self Care | Payer: Medicare Other | Attending: Emergency Medicine | Admitting: Emergency Medicine

## 2019-12-26 ENCOUNTER — Inpatient Hospital Stay: Payer: Medicare Other

## 2019-12-26 ENCOUNTER — Other Ambulatory Visit: Payer: Self-pay

## 2019-12-26 ENCOUNTER — Encounter: Payer: Self-pay | Admitting: Emergency Medicine

## 2019-12-26 ENCOUNTER — Inpatient Hospital Stay: Admit: 2019-12-26 | Payer: Medicare Other

## 2019-12-26 DIAGNOSIS — Z8249 Family history of ischemic heart disease and other diseases of the circulatory system: Secondary | ICD-10-CM

## 2019-12-26 DIAGNOSIS — H409 Unspecified glaucoma: Secondary | ICD-10-CM | POA: Diagnosis present

## 2019-12-26 DIAGNOSIS — I214 Non-ST elevation (NSTEMI) myocardial infarction: Secondary | ICD-10-CM | POA: Diagnosis not present

## 2019-12-26 DIAGNOSIS — I4891 Unspecified atrial fibrillation: Secondary | ICD-10-CM | POA: Diagnosis present

## 2019-12-26 DIAGNOSIS — G928 Other toxic encephalopathy: Secondary | ICD-10-CM | POA: Diagnosis present

## 2019-12-26 DIAGNOSIS — I959 Hypotension, unspecified: Secondary | ICD-10-CM | POA: Diagnosis not present

## 2019-12-26 DIAGNOSIS — R140 Abdominal distension (gaseous): Secondary | ICD-10-CM

## 2019-12-26 DIAGNOSIS — E871 Hypo-osmolality and hyponatremia: Secondary | ICD-10-CM | POA: Diagnosis present

## 2019-12-26 DIAGNOSIS — R579 Shock, unspecified: Secondary | ICD-10-CM

## 2019-12-26 DIAGNOSIS — A419 Sepsis, unspecified organism: Secondary | ICD-10-CM | POA: Diagnosis present

## 2019-12-26 DIAGNOSIS — Z515 Encounter for palliative care: Secondary | ICD-10-CM | POA: Diagnosis not present

## 2019-12-26 DIAGNOSIS — N186 End stage renal disease: Secondary | ICD-10-CM | POA: Diagnosis not present

## 2019-12-26 DIAGNOSIS — G4733 Obstructive sleep apnea (adult) (pediatric): Secondary | ICD-10-CM | POA: Diagnosis present

## 2019-12-26 DIAGNOSIS — H5789 Other specified disorders of eye and adnexa: Secondary | ICD-10-CM | POA: Diagnosis present

## 2019-12-26 DIAGNOSIS — Z7982 Long term (current) use of aspirin: Secondary | ICD-10-CM

## 2019-12-26 DIAGNOSIS — I248 Other forms of acute ischemic heart disease: Secondary | ICD-10-CM | POA: Diagnosis not present

## 2019-12-26 DIAGNOSIS — I130 Hypertensive heart and chronic kidney disease with heart failure and stage 1 through stage 4 chronic kidney disease, or unspecified chronic kidney disease: Secondary | ICD-10-CM | POA: Diagnosis present

## 2019-12-26 DIAGNOSIS — Z833 Family history of diabetes mellitus: Secondary | ICD-10-CM

## 2019-12-26 DIAGNOSIS — R7989 Other specified abnormal findings of blood chemistry: Secondary | ICD-10-CM | POA: Diagnosis present

## 2019-12-26 DIAGNOSIS — E8581 Light chain (AL) amyloidosis: Secondary | ICD-10-CM | POA: Diagnosis present

## 2019-12-26 DIAGNOSIS — Z79899 Other long term (current) drug therapy: Secondary | ICD-10-CM

## 2019-12-26 DIAGNOSIS — E1122 Type 2 diabetes mellitus with diabetic chronic kidney disease: Secondary | ICD-10-CM | POA: Diagnosis present

## 2019-12-26 DIAGNOSIS — Z6831 Body mass index (BMI) 31.0-31.9, adult: Secondary | ICD-10-CM

## 2019-12-26 DIAGNOSIS — Z9049 Acquired absence of other specified parts of digestive tract: Secondary | ICD-10-CM

## 2019-12-26 DIAGNOSIS — E8729 Other acidosis: Secondary | ICD-10-CM

## 2019-12-26 DIAGNOSIS — E854 Organ-limited amyloidosis: Principal | ICD-10-CM | POA: Diagnosis present

## 2019-12-26 DIAGNOSIS — E878 Other disorders of electrolyte and fluid balance, not elsewhere classified: Secondary | ICD-10-CM

## 2019-12-26 DIAGNOSIS — Z452 Encounter for adjustment and management of vascular access device: Secondary | ICD-10-CM

## 2019-12-26 DIAGNOSIS — R570 Cardiogenic shock: Secondary | ICD-10-CM | POA: Diagnosis present

## 2019-12-26 DIAGNOSIS — N179 Acute kidney failure, unspecified: Secondary | ICD-10-CM | POA: Diagnosis not present

## 2019-12-26 DIAGNOSIS — R531 Weakness: Secondary | ICD-10-CM | POA: Diagnosis present

## 2019-12-26 DIAGNOSIS — Z87891 Personal history of nicotine dependence: Secondary | ICD-10-CM

## 2019-12-26 DIAGNOSIS — R7401 Elevation of levels of liver transaminase levels: Secondary | ICD-10-CM | POA: Diagnosis not present

## 2019-12-26 DIAGNOSIS — C9000 Multiple myeloma not having achieved remission: Secondary | ICD-10-CM | POA: Diagnosis present

## 2019-12-26 DIAGNOSIS — Z66 Do not resuscitate: Secondary | ICD-10-CM | POA: Diagnosis present

## 2019-12-26 DIAGNOSIS — J9600 Acute respiratory failure, unspecified whether with hypoxia or hypercapnia: Secondary | ICD-10-CM

## 2019-12-26 DIAGNOSIS — E785 Hyperlipidemia, unspecified: Secondary | ICD-10-CM | POA: Diagnosis present

## 2019-12-26 DIAGNOSIS — Z20822 Contact with and (suspected) exposure to covid-19: Secondary | ICD-10-CM | POA: Diagnosis present

## 2019-12-26 DIAGNOSIS — I5043 Acute on chronic combined systolic (congestive) and diastolic (congestive) heart failure: Secondary | ICD-10-CM | POA: Diagnosis present

## 2019-12-26 DIAGNOSIS — N184 Chronic kidney disease, stage 4 (severe): Secondary | ICD-10-CM | POA: Diagnosis present

## 2019-12-26 DIAGNOSIS — J9602 Acute respiratory failure with hypercapnia: Secondary | ICD-10-CM | POA: Diagnosis not present

## 2019-12-26 DIAGNOSIS — R6521 Severe sepsis with septic shock: Secondary | ICD-10-CM

## 2019-12-26 DIAGNOSIS — R6520 Severe sepsis without septic shock: Secondary | ICD-10-CM | POA: Diagnosis not present

## 2019-12-26 DIAGNOSIS — N400 Enlarged prostate without lower urinary tract symptoms: Secondary | ICD-10-CM | POA: Diagnosis present

## 2019-12-26 DIAGNOSIS — I43 Cardiomyopathy in diseases classified elsewhere: Secondary | ICD-10-CM | POA: Diagnosis present

## 2019-12-26 DIAGNOSIS — J9601 Acute respiratory failure with hypoxia: Secondary | ICD-10-CM | POA: Diagnosis not present

## 2019-12-26 DIAGNOSIS — E872 Acidosis: Secondary | ICD-10-CM | POA: Diagnosis present

## 2019-12-26 DIAGNOSIS — K761 Chronic passive congestion of liver: Secondary | ICD-10-CM | POA: Diagnosis present

## 2019-12-26 DIAGNOSIS — M1990 Unspecified osteoarthritis, unspecified site: Secondary | ICD-10-CM | POA: Diagnosis present

## 2019-12-26 LAB — HIV ANTIBODY (ROUTINE TESTING W REFLEX): HIV Screen 4th Generation wRfx: NONREACTIVE

## 2019-12-26 LAB — BASIC METABOLIC PANEL
Anion gap: 19 — ABNORMAL HIGH (ref 5–15)
BUN: 64 mg/dL — ABNORMAL HIGH (ref 8–23)
CO2: 21 mmol/L — ABNORMAL LOW (ref 22–32)
Calcium: 7.8 mg/dL — ABNORMAL LOW (ref 8.9–10.3)
Chloride: 87 mmol/L — ABNORMAL LOW (ref 98–111)
Creatinine, Ser: 6.15 mg/dL — ABNORMAL HIGH (ref 0.61–1.24)
GFR, Estimated: 9 mL/min — ABNORMAL LOW (ref >60)
Glucose, Bld: 79 mg/dL (ref 70–99)
Potassium: 4.8 mmol/L (ref 3.5–5.1)
Sodium: 127 mmol/L — ABNORMAL LOW (ref 135–145)

## 2019-12-26 LAB — URINALYSIS, COMPLETE (UACMP) WITH MICROSCOPIC
Glucose, UA: 50 mg/dL — AB
Ketones, ur: NEGATIVE mg/dL
Leukocytes,Ua: NEGATIVE
Nitrite: NEGATIVE
Protein, ur: 100 mg/dL — AB
Specific Gravity, Urine: 1.023 (ref 1.005–1.030)
pH: 5 (ref 5.0–8.0)

## 2019-12-26 LAB — CBC
Hemoglobin: 12.5 g/dL — ABNORMAL LOW (ref 13.0–17.0)
Platelets: 132 10*3/uL — ABNORMAL LOW (ref 150–400)
WBC: 10.4 10*3/uL (ref 4.0–10.5)

## 2019-12-26 LAB — PROTIME-INR
INR: 1.4 — ABNORMAL HIGH (ref 0.8–1.2)
Prothrombin Time: 16.5 seconds — ABNORMAL HIGH (ref 11.4–15.2)

## 2019-12-26 LAB — TROPONIN I (HIGH SENSITIVITY)
Troponin I (High Sensitivity): 1363 ng/L (ref <18)
Troponin I (High Sensitivity): 1382 ng/L (ref <18)

## 2019-12-26 LAB — BRAIN NATRIURETIC PEPTIDE: B Natriuretic Peptide: 918.3 pg/mL — ABNORMAL HIGH (ref 0.0–100.0)

## 2019-12-26 LAB — HEPATIC FUNCTION PANEL
ALT: 81 U/L — ABNORMAL HIGH (ref 0–44)
AST: 195 U/L — ABNORMAL HIGH (ref 15–41)
Albumin: 1.8 g/dL — ABNORMAL LOW (ref 3.5–5.0)
Alkaline Phosphatase: 413 U/L — ABNORMAL HIGH (ref 38–126)
Bilirubin, Direct: 4.7 mg/dL — ABNORMAL HIGH (ref 0.0–0.2)
Indirect Bilirubin: 2.9 mg/dL — ABNORMAL HIGH (ref 0.3–0.9)
Total Bilirubin: 7.6 mg/dL — ABNORMAL HIGH (ref 0.3–1.2)
Total Protein: 4.2 g/dL — ABNORMAL LOW (ref 6.5–8.1)

## 2019-12-26 LAB — LACTIC ACID, PLASMA
Lactic Acid, Venous: 7.1 mmol/L (ref 0.5–1.9)
Lactic Acid, Venous: 7.5 mmol/L (ref 0.5–1.9)
Lactic Acid, Venous: 5.9 mmol/L (ref 0.5–1.9)

## 2019-12-26 LAB — RESP PANEL BY RT-PCR (FLU A&B, COVID) ARPGX2
Influenza A by PCR: NEGATIVE
Influenza B by PCR: NEGATIVE
SARS Coronavirus 2 by RT PCR: NEGATIVE

## 2019-12-26 LAB — MRSA PCR SCREENING: MRSA by PCR: NEGATIVE

## 2019-12-26 LAB — APTT: aPTT: 53 seconds — ABNORMAL HIGH (ref 24–36)

## 2019-12-26 LAB — TSH: TSH: 2.514 u[IU]/mL (ref 0.350–4.500)

## 2019-12-26 LAB — PROCALCITONIN: Procalcitonin: 4.24 ng/mL

## 2019-12-26 LAB — MAGNESIUM: Magnesium: 2.2 mg/dL (ref 1.7–2.4)

## 2019-12-26 MED ORDER — ALBUMIN HUMAN 25 % IV SOLN
12.5000 g | Freq: Once | INTRAVENOUS | Status: AC
  Administered 2019-12-26: 12.5 g via INTRAVENOUS
  Filled 2019-12-26: qty 50

## 2019-12-26 MED ORDER — FAMOTIDINE IN NACL 20-0.9 MG/50ML-% IV SOLN
20.0000 mg | Freq: Two times a day (BID) | INTRAVENOUS | Status: DC
  Administered 2019-12-26: 20 mg via INTRAVENOUS
  Filled 2019-12-26: qty 50

## 2019-12-26 MED ORDER — DOCUSATE SODIUM 100 MG PO CAPS: 100.0000 mg | ORAL_CAPSULE | Freq: Two times a day (BID) | ORAL | Status: DC | PRN

## 2019-12-26 MED ORDER — SODIUM CHLORIDE 0.9 % IV SOLN
2.0000 g | Freq: Once | INTRAVENOUS | Status: AC
  Administered 2019-12-26: 2 g via INTRAVENOUS
  Filled 2019-12-26: qty 2

## 2019-12-26 MED ORDER — NOREPINEPHRINE 16 MG/250ML-% IV SOLN
0.0000 ug/min | INTRAVENOUS | Status: DC
  Administered 2019-12-26: 35 ug/min via INTRAVENOUS
  Administered 2019-12-27: 38 ug/min via INTRAVENOUS
  Administered 2019-12-27 – 2019-12-29 (×6): 40 ug/min via INTRAVENOUS
  Administered 2019-12-29: 35 ug/min via INTRAVENOUS
  Administered 2019-12-30: 40 ug/min via INTRAVENOUS
  Filled 2019-12-26 (×13): qty 250

## 2019-12-26 MED ORDER — HEPARIN SODIUM (PORCINE) 5000 UNIT/ML IJ SOLN: 5000.0000 [IU] | Freq: Three times a day (TID) | INTRAMUSCULAR | Status: DC

## 2019-12-26 MED ORDER — LACTATED RINGERS IV BOLUS: 500.0000 mL | Freq: Once | INTRAVENOUS | Status: DC

## 2019-12-26 MED ORDER — LACTATED RINGERS IV BOLUS: 1000.0000 mL | Freq: Once | INTRAVENOUS | Status: DC

## 2019-12-26 MED ORDER — ACETAMINOPHEN 325 MG PO TABS
650.0000 mg | ORAL_TABLET | ORAL | Status: DC | PRN
  Filled 2019-12-26: qty 2

## 2019-12-26 MED ORDER — SODIUM CHLORIDE 0.9% FLUSH
3.0000 mL | Freq: Two times a day (BID) | INTRAVENOUS | Status: DC
  Administered 2019-12-26 – 2019-12-29 (×4): 3 mL via INTRAVENOUS

## 2019-12-26 MED ORDER — VASOPRESSIN 20 UNITS/100 ML INFUSION FOR SHOCK
0.0000 [IU]/min | INTRAVENOUS | Status: DC
  Administered 2019-12-26: 0.03 [IU]/min via INTRAVENOUS
  Administered 2019-12-27 – 2019-12-30 (×8): 0.04 [IU]/min via INTRAVENOUS
  Filled 2019-12-26 (×11): qty 100

## 2019-12-26 MED ORDER — CHLORHEXIDINE GLUCONATE CLOTH 2 % EX PADS
6.0000 | MEDICATED_PAD | Freq: Every day | CUTANEOUS | Status: DC
  Administered 2019-12-26 – 2019-12-29 (×3): 6 via TOPICAL

## 2019-12-26 MED ORDER — PANTOPRAZOLE SODIUM 40 MG PO TBEC
40.0000 mg | DELAYED_RELEASE_TABLET | Freq: Every day | ORAL | Status: DC
  Administered 2019-12-27 – 2019-12-29 (×3): 40 mg via ORAL
  Filled 2019-12-26 (×4): qty 1

## 2019-12-26 MED ORDER — LACTATED RINGERS IV BOLUS
500.0000 mL | Freq: Once | INTRAVENOUS | Status: AC
  Administered 2019-12-26: 500 mL via INTRAVENOUS

## 2019-12-26 MED ORDER — HEPARIN BOLUS VIA INFUSION
4000.0000 [IU] | Freq: Once | INTRAVENOUS | Status: AC
  Administered 2019-12-26: 4000 [IU] via INTRAVENOUS
  Filled 2019-12-26: qty 4000

## 2019-12-26 MED ORDER — SODIUM CHLORIDE 0.9% FLUSH: 3.0000 mL | INTRAVENOUS | Status: DC | PRN

## 2019-12-26 MED ORDER — LATANOPROST 0.005 % OP SOLN
1.0000 [drp] | Freq: Every day | OPHTHALMIC | Status: DC
  Administered 2019-12-26 – 2019-12-29 (×2): 1 [drp] via OPHTHALMIC
  Filled 2019-12-26: qty 2.5

## 2019-12-26 MED ORDER — HEPARIN (PORCINE) 25000 UT/250ML-% IV SOLN
1600.0000 [IU]/h | INTRAVENOUS | Status: DC
  Administered 2019-12-26: 1350 [IU]/h via INTRAVENOUS
  Administered 2019-12-28: 1600 [IU]/h via INTRAVENOUS
  Filled 2019-12-26 (×3): qty 250

## 2019-12-26 MED ORDER — ASPIRIN 81 MG PO CHEW
324.0000 mg | CHEWABLE_TABLET | Freq: Once | ORAL | Status: AC
  Administered 2019-12-26: 324 mg via ORAL
  Filled 2019-12-26: qty 4

## 2019-12-26 MED ORDER — MIDODRINE HCL 5 MG PO TABS
15.0000 mg | ORAL_TABLET | Freq: Three times a day (TID) | ORAL | Status: DC
  Administered 2019-12-27 – 2019-12-28 (×4): 15 mg via ORAL
  Filled 2019-12-26 (×5): qty 3

## 2019-12-26 MED ORDER — IOHEXOL 9 MG/ML PO SOLN
500.0000 mL | ORAL | Status: AC
  Administered 2019-12-26 (×2): 500 mL via ORAL

## 2019-12-26 MED ORDER — SODIUM CHLORIDE 0.9 % IV SOLN: 250.0000 mL | INTRAVENOUS | Status: DC | PRN

## 2019-12-26 MED ORDER — ONDANSETRON HCL 4 MG/2ML IJ SOLN
4.0000 mg | Freq: Four times a day (QID) | INTRAMUSCULAR | Status: DC | PRN
  Administered 2019-12-29: 4 mg via INTRAVENOUS
  Filled 2019-12-26: qty 2

## 2019-12-26 MED ORDER — LIDOCAINE HCL 1 % IJ SOLN
5.0000 mL | Freq: Once | INTRAMUSCULAR | Status: DC
  Filled 2019-12-26: qty 10
  Filled 2019-12-26: qty 5

## 2019-12-26 MED ORDER — FUROSEMIDE 10 MG/ML IJ SOLN
40.0000 mg | Freq: Once | INTRAMUSCULAR | Status: AC
  Administered 2019-12-26: 40 mg via INTRAVENOUS
  Filled 2019-12-26: qty 4

## 2019-12-26 MED ORDER — POLYETHYLENE GLYCOL 3350 17 G PO PACK: 17.0000 g | PACK | Freq: Every day | ORAL | Status: DC | PRN

## 2019-12-26 MED ORDER — TAMSULOSIN HCL 0.4 MG PO CAPS
0.4000 mg | ORAL_CAPSULE | Freq: Every day | ORAL | Status: DC
  Administered 2019-12-27 – 2019-12-29 (×3): 0.4 mg via ORAL
  Filled 2019-12-26 (×4): qty 1

## 2019-12-26 MED ORDER — HYDROCORTISONE NA SUCCINATE PF 100 MG IJ SOLR
50.0000 mg | Freq: Four times a day (QID) | INTRAMUSCULAR | Status: DC
  Administered 2019-12-27 – 2019-12-30 (×14): 50 mg via INTRAVENOUS
  Filled 2019-12-26 (×14): qty 2

## 2019-12-26 MED ORDER — ATORVASTATIN CALCIUM 20 MG PO TABS
40.0000 mg | ORAL_TABLET | Freq: Every day | ORAL | Status: DC
  Administered 2019-12-27 – 2019-12-29 (×3): 40 mg via ORAL
  Filled 2019-12-26 (×3): qty 2

## 2019-12-26 MED ORDER — FLUOCINONIDE 0.05 % EX CREA
1.0000 "application " | TOPICAL_CREAM | CUTANEOUS | Status: DC
  Administered 2019-12-29: 1 via TOPICAL
  Filled 2019-12-26: qty 15

## 2019-12-26 MED ORDER — PHENYLEPHRINE CONCENTRATED 100MG/250ML (0.4 MG/ML) INFUSION SIMPLE
0.0000 ug/min | INTRAVENOUS | Status: DC
  Administered 2019-12-26: 40 ug/min via INTRAVENOUS
  Administered 2019-12-27: 400 ug/min via INTRAVENOUS
  Administered 2019-12-28: 290 ug/min via INTRAVENOUS
  Administered 2019-12-28: 320 ug/min via INTRAVENOUS
  Administered 2019-12-28: 350 ug/min via INTRAVENOUS
  Administered 2019-12-28: 220 ug/min via INTRAVENOUS
  Administered 2019-12-29: 230 ug/min via INTRAVENOUS
  Administered 2019-12-29: 210 ug/min via INTRAVENOUS
  Administered 2019-12-29: 220 ug/min via INTRAVENOUS
  Administered 2019-12-30: 350 ug/min via INTRAVENOUS
  Administered 2019-12-30: 400 ug/min via INTRAVENOUS
  Filled 2019-12-26 (×14): qty 250

## 2019-12-26 MED ORDER — NOREPINEPHRINE 4 MG/250ML-% IV SOLN
0.0000 ug/min | INTRAVENOUS | Status: DC
  Administered 2019-12-26: 10 ug/min via INTRAVENOUS
  Administered 2019-12-26: 40 ug/min via INTRAVENOUS
  Filled 2019-12-26 (×3): qty 250

## 2019-12-26 MED ORDER — PIPERACILLIN-TAZOBACTAM IN DEX 2-0.25 GM/50ML IV SOLN
2.2500 g | Freq: Three times a day (TID) | INTRAVENOUS | Status: DC
  Administered 2019-12-26: 2.25 g via INTRAVENOUS
  Filled 2019-12-26 (×4): qty 50

## 2019-12-26 NOTE — Consult Note (Addendum)
Cardiology Consultation:   Patient ID: Mason Johnson MRN: [id number]; DOB: 1950-06-26  Admit date: 12/25/2019 Date of Consult: 12/13/2019  Primary Care Provider: Arnetha Courser, MD [facility] Cardiologist: [facility] cardiology [place] Electrophysiologist:  None    Patient Profile:   Mason Johnson is a 69 y.o. male with a hx of multiple myeloma, cardiac amyloid who is being seen today for the evaluation of edema, fatigue at the request of Dr. Tamala Julian.  History of Present Illness:   Mason Johnson is a 69 year old gentleman with history of multiple myeloma, cardiac amyloid, hyperlipidemia, diabetes who presents due to volume overload. Patient was recently admitted for about a week, discharged 3 days ago in [place] secondary to shortness of breath and volume overload. He was managed with IV Lasix. Started on Bumex 2 mg twice daily upon discharge. He felt okay on discharge, but subsequently felt weak, noticed edema over the past 2 days which prompted him coming to the ED. Patient came to [place] to visit family for the holidays. Denies chest pain, palpitations. His blood pressures at home was 35D systolic today. He was placed on midodrine 15 mg 3 times daily for BP support. His baseline blood pressure is usually in the low 97C systolic.  He was diagnosed with multiple myeloma in June 2021, currently on chemotherapy. 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 Follows up with amyloid specialist/cardiology at [facility] [place]. His next chemo treatment is next week Friday. His baseline systolic blood pressure is in the low 90s.  In the ED, BP was 75/56, EKG showed sinus rhythm, occasional SVC, low QRS voltage. Creatinine was elevated at 7.8, troponin  1363. He was started on Levophed with improvement in blood pressure upon my exam to systolics of 93.   Past Medical History:  Diagnosis Date  . Arthritis   . Diabetes mellitus  without complication ([place])   . Glaucoma (increased eye pressure)   . Hyperlipidemia   . Hypertension   . Sleep apnea    cpap occasionally    Past Surgical History:  Procedure Laterality Date  . APPENDECTOMY    . LUMBAR LAMINECTOMY/DECOMPRESSION MICRODISCECTOMY N/A 06/19/2013   Procedure: LUMBAR LAMINECTOMY/DECOMPRESSION MICRODISCECTOMY 3 LEVELS  lumbar two/three, three/four, four/five;  Surgeon: Consuella Lose, MD;  Location: [facility];  Service: Neurosurgery;  Laterality: N/A;  . TONSILLECTOMY       Home Medications:  Prior to Admission medications   Medication Sig Start Date End Date Taking? Authorizing Provider  acetaminophen (TYLENOL) 500 MG tablet Take 500-1,000 mg by mouth every 6 (six) hours as needed for mild pain or fever.   Yes [provider]  acyclovir (ZOVIRAX) 800 MG tablet Take 800 mg by mouth daily. 10/05/19  Yes [provider]  allopurinol (ZYLOPRIM) 300 MG tablet Take 300 mg by mouth daily. 09/09/19  Yes [provider]  aMILoride (MIDAMOR) 5 MG tablet Take 10 mg by mouth 2 (two) times daily. 12/24/19  Yes [provider]  atorvastatin (LIPITOR) 80 MG tablet Take 40 mg by mouth daily.    Yes [provider]  bumetanide (BUMEX) 2 MG tablet Take 4 mg by mouth 2 (two) times daily. 12/07/19  Yes [provider]  cholecalciferol (VITAMIN D) 25 MCG (1000 UNIT) tablet Take 1,000 Units by mouth daily. 09/09/19  Yes [provider]  cyclophosphamide (CYTOXAN) 50 MG capsule Take 350 mg by mouth every Friday.    Yes [provider]  doxycycline (VIBRAMYCIN)  100 MG capsule Take 100 mg by mouth 2 (two) times daily.   Yes [provider]  fluocinonide cream (LIDEX) 3.90 % Apply 1 application topically as directed. 11/13/19  Yes [provider]  latanoprost (XALATAN) 0.005 % ophthalmic solution Place 1 drop into both eyes at bedtime.   Yes [provider]  Melaton-LBalm-Cham-Lav-Brom  (MIDNITE SLEEP AID PO) Take 1 tablet by mouth as directed.   Yes [provider]  midodrine (PROAMATINE) 10 MG tablet Take 15 mg by mouth 3 (three) times daily. 12/24/19  Yes [provider]  ondansetron (ZOFRAN) 4 MG tablet Take 4 mg by mouth 2 (two) times daily as needed for nausea or vomiting. 11/08/19  Yes [provider]  pantoprazole (PROTONIX) 40 MG tablet Take 40 mg by mouth daily.   Yes [provider]  potassium chloride SA (KLOR-CON) 20 MEQ tablet Take 20 mEq by mouth daily.   Yes [provider]  tamsulosin (FLOMAX) 0.4 MG CAPS capsule Take 0.4 mg by mouth daily. 09/11/19  Yes [provider]  timolol (BETIMOL) 0.5 % ophthalmic solution Place 1 drop into both eyes daily.    Yes [provider]  triamcinolone (KENALOG) 0.1 % Apply 1 application topically 2 (two) times daily. 11/15/19  Yes [provider]  aspirin EC 81 MG tablet Take 81 mg by mouth daily.    [provider]    Inpatient Medications: Scheduled Meds:  Continuous Infusions: . heparin 1,350 Units/hr (12/04/2019 1708)  . norepinephrine (LEVOPHED) Adult infusion 35 mcg/min (12/17/2019 1627)   PRN Meds:   Allergies:   No Known Allergies  Social History:   Social History   Socioeconomic History  . Marital status: Married    Spouse name: Not on file  . Number of children: Not on file  . Years of education: Not on file  . Highest education level: Not on file  Occupational History  . Not on file  Tobacco Use  . Smoking status: Former Smoker    Packs/day: 1.50    Years: 50.00    Pack years: 75.00    Types: Cigarettes    Quit date: 02/11/2013    Years since quitting: 6.8  . Smokeless tobacco: Never Used  Substance and Sexual Activity  . Alcohol use: Yes    Alcohol/week: 10.0 standard drinks    Types: 3 Cans of beer, 7 Shots of liquor per week    Comment: daily cocktails 3-4  . Drug use: No  . Sexual activity: Not on file  Other  Topics Concern  . Not on file  Social History Narrative  . Not on file   Social Determinants of Health   Financial Resource Strain:   . Difficulty of Paying Living Expenses: Not on file  Food Insecurity:   . Worried About Charity fundraiser in the Last Year: Not on file  . Ran Out of Food in the Last Year: Not on file  Transportation Needs:   . Lack of Transportation (Medical): Not on file  . Lack of Transportation (Non-Medical): Not on file  Physical Activity:   . Days of Exercise per Week: Not on file  . Minutes of Exercise per Session: Not on file  Stress:   . Feeling of Stress : Not on file  Social Connections:   . Frequency of Communication with Friends and Family: Not on file  . Frequency of Social Gatherings with Friends and Family: Not on file  . Attends Religious Services: Not on  file  . Active Member of Clubs or Organizations: Not on file  . Attends Archivist Meetings: Not on file  . Marital Status: Not on file  Intimate Partner Violence:   . Fear of Current or Ex-Partner: Not on file  . Emotionally Abused: Not on file  . Physically Abused: Not on file  . Sexually Abused: Not on file    Family History:    Family History  Problem Relation Age of Onset  . Diabetes Sister   . Diabetes Sister   . Diabetes Sister   . Diabetes Sister   . Hypertension Mother   . Heart disease Mother   . Heart failure Mother   . Stroke Brother   . Heart failure Brother   . Heart disease Brother      ROS:  Please see the history of present illness.   All other ROS reviewed and negative.     Physical Exam/Data:   Vitals:   12/18/2019 1630 12/18/2019 1635 12/18/2019 1645 12/25/2019 1700  BP: (!) _0 94/64  Pulse: 85 87 89 91  Resp: 11 (!) _1 Temp:      TempSrc:      SpO2: 93% 98% 100% 100%  Weight:      Height:        Intake/Output Summary (Last 24 hours) at 12/21/2019 1711 Last data filed at 12/11/2019 1645 Gross per 24 hour  Intake 500  ml  Output 3 ml  Net 497 ml   Last 3 Weights 12/29/2019 11/25/2017 04/27/2016  Weight (lbs) 227 lb 261 lb 1.6 oz 272 lb 12.8 oz  Weight (kg) 102.967 kg 118.434 kg 123.741 kg     Body mass index is 29.95 kg/m.  General:  Well nourished, well developed, mild respiratory distress HEENT: normal Neck: Difficult to assess due to body habitus Endocrine:  No thryomegaly Vascular: No carotid bruits; FA pulses 2+ bilaterally without bruits  Cardiac:  normal S1, S2; RRR; no murmur  Lungs: Decreased breath sounds at bases, Abd: soft, nontender, distended Ext: 2+ pitting edema Musculoskeletal:  No deformities, BUE and BLE strength normal and equal Skin: warm and dry  Neuro:  CNs 2-12 intact, no focal abnormalities noted Psych:  Normal affect   EKG:  The EKG was personally reviewed and demonstrates: Normal sinus rhythm, low QRS voltage Telemetry:  Telemetry was personally reviewed and demonstrates: Sinus rhythm, low QRS voltage  Relevant CV Studies: Echocardiogram pending  Laboratory Data:  High Sensitivity Troponin:   Recent Labs  Lab 12/24/2019 1511  TROPONINIHS 1,363*     Chemistry Recent Labs  Lab 12/13/2019 1511  NA 127*  K 4.8  CL 87*  CO2 21*  GLUCOSE 79  BUN 64*  CREATININE 6.15*  CALCIUM 7.8*  GFRNONAA 9*  ANIONGAP 19*    Recent Labs  Lab 12/15/2019 1511  PROT 4.2*  ALBUMIN 1.8*  AST 195*  ALT 81*  ALKPHOS 413*  BILITOT 7.6*   Hematology Recent Labs  Lab 12/25/2019 1511  WBC 10.4  RBC RESULTS UNAVAILABLE DUE TO INTERFERING SUBSTANCE  HGB 12.5*  HCT RESULTS UNAVAILABLE DUE TO INTERFERING SUBSTANCE  MCV RESULTS UNAVAILABLE DUE TO INTERFERING SUBSTANCE  MCH RESULTS UNAVAILABLE DUE TO INTERFERING SUBSTANCE  MCHC RESULTS UNAVAILABLE DUE TO INTERFERING SUBSTANCE  RDW RESULTS UNAVAILABLE DUE TO INTERFERING SUBSTANCE  PLT 132*   BNP Recent Labs  Lab 12/03/2019 1511  BNP 918.3*    DDimer No results for input(s): DDIMER in the last 168  hours.   Radiology/Studies:  DG Chest Port 1 View  Result Date: 12/25/2019 CLINICAL DATA:  Hypotension EXAM: PORTABLE CHEST 1 VIEW COMPARISON:  10/25/2013 FINDINGS: Mild cardiomegaly. Minimal atelectasis left base. No consolidation or effusion. No pneumothorax. IMPRESSION: Mild cardiomegaly. Minimal atelectasis at the left base. Electronically Signed   By: Donavan Foil M.D.   On: 12/31/2019 15:47     Assessment and Plan:   1. Hx of Cardiac amyloid- AL, edema -EKG shows sinus rhythm, low QRS voltage (no atrial fibrillation) -Levophed for BP support. Titrate off as blood pressure permits. May consider milrinone instead for BP support while diuresing -Start PTA midodrine 15 mg 3 times daily -Get echo to evaluate ejection fraction -Lasix 40 mg IV x1. Can give additional diuretics as BP permits. -Monitor ins and outs, daily weights, urine output -Overall, treatment for amyloid falls back to treating his multiple myeloma as scheduled with chemo.  2. Acute kidney injury -Monitor creatinine with Lasix -Recommend renal consult  3. Elevated troponins -Initial troponins 1363, no chest pain -Continue to trend until peaked -EKG with no acute ST changes, shows sinus rhythm with low QRS voltage, no evidence for atrial fibrillation. -Heparin GTT okay -Get echo  4. Multiple myeloma -Next chemo treatment scheduled outpatient next week  5. Hyperlipidemia -PTA statin  6. Elevated lactate, -Antibiotics, etiology, management as per primary team   Signed, Kate Sable, MD  12/10/2019 5:11 PM

## 2019-12-26 NOTE — Progress Notes (Signed)
PHARMACY -  BRIEF ANTIBIOTIC NOTE   Pharmacy has received consult(s) for cefepime from an ED provider for bacteremia.  The patient's profile has been reviewed for ht/wt/allergies/indication/available labs.    One time order(s) placed for cefepime 2 gm  Further antibiotics/pharmacy consults should be ordered by admitting physician if indicated.                       Thank you, Renda Rolls, PharmD, [facility] 12/17/2019 5:21 PM

## 2019-12-26 NOTE — ED Triage Notes (Signed)
Pt comes into the ED via POV c/o hypotension at home with systolic in the 13'K per family.  Pt admits to feeling increased in lethargy, weakness, and unable to sleep.  Pt denies any CP, dizziness, SHOB, N/V.  Pt has even and unlabored respirations at this time. Pt was recently admitted to the hospital at [facility] for amyloidosis.

## 2019-12-26 NOTE — Consult Note (Signed)
Pharmacy Antibiotic Note  Mason Johnson is a 69 y.o. male with medical history including multiple myeloma on chemotherapy, cardiac amyloid admitted on 12/16/2019 with sepsis. Patient presented to the ED 11/24 with hypotension, lethargy, weakness. Labs notable for Scr 6.15 (was 2.8 on 11/21), troponin 1363, lactic acid 7.1, PCT 4.24, elevated transaminases. Vitals with hypotension. ECHO is pending. Pharmacy has been consulted for Zosyn dosing.  Plan: Zosyn 2.25 g IV q8h over 30 minutes (CrCl < 20)  Will continue to monitor renal function and adjust antibiotics as indicated. Continue to monitor culture results and narrow antibiotics as able.  Height: _0  (185.4 cm) Weight: 103 kg (227 lb) IBW/kg (Calculated) : 79.9  Temp (24hrs), Avg:97.6 F (36.4 C), Min:97.4 F (36.3 C), Max:97.8 F (36.6 C)  Recent Labs  Lab 12/17/2019 1511 12/24/2019 1741  WBC 10.4  --   CREATININE 6.15*  --   LATICACIDVEN 7.1* 7.5*    Estimated Creatinine Clearance: 14.1 mL/min (A) (by C-G formula based on SCr of 6.15 mg/dL (H)).    No Known Allergies  Antimicrobials this admission: Cefepime 11/24 x 1 Zosyn 11/24 >>   Dose adjustments this admission: N/A  Microbiology results: 11/24 BCx: pending 11/24 UCx: pending  11/24 MRSA PCR: negative  Thank you for allowing pharmacy to be a part of this patient's care.  Benita Gutter 12/17/2019 9:00 PM

## 2019-12-26 NOTE — Progress Notes (Addendum)
ANTICOAGULATION CONSULT NOTE  Pharmacy Consult for Heparin infusion Indication: ACS/STEMI  No Known Allergies  Patient Measurements: Height: 6\' 1"  (185.4 cm) Weight: 103 kg (227 lb) IBW/kg (Calculated) : 79.9 Heparin Dosing Weight: 100.8 kg  Vital Signs: Temp: 97.8 F (36.6 C) (11/24 1457) Temp Source: Oral (11/24 1457) BP: 94/64 (11/24 1700) Pulse Rate: 91 (11/24 1700)  Labs: Recent Labs    12/22/2019 1511  HGB 12.5*  HCT RESULTS UNAVAILABLE DUE TO INTERFERING SUBSTANCE  PLT 132*  APTT 53*  LABPROT 16.5*  INR 1.4*  CREATININE 6.15*  TROPONINIHS 1,363*    Estimated Creatinine Clearance: 14.1 mL/min (A) (by C-G formula based on SCr of 6.15 mg/dL (H)).  Assessment:  69 y.o. male presenting with hypotension resulting in general malaise.  BNP 918, HS Troponin 1363, LA 7.1   Goal of Therapy:  Heparin level 0.3-0.7 units/ml Monitor platelets by anticoagulation protocol: Yes   Plan:  Give 4000 units bolus x 1 Start heparin infusion at 1350 units/hr Check anti-Xa level in 8 hours and daily while on heparin Continue to monitor H&H and platelets  Renda Rolls, PharmD, [facility] 12/29/2019 5:29 PM

## 2019-12-26 NOTE — H&P (Addendum)
Name: Mason Johnson MRN: [id number] DOB: 06-09-1950     CONSULTATION DATE: 12/14/2019  REFERRING MD :  Tamala Julian  CHIEF COMPLAINT:  Feeling very weak  HISTORY OF PRESENT ILLNESS:  69 year old gentleman with history of multiple myeloma, cardiac amyloid, hyperlipidemia, diabetes who presents due to volume overload.   Patient was recently admitted for about a week, discharged 3 days ago in [place] secondary to shortness of breath and volume overload.   He was managed with IV Lasix. Started on Bumex 2 mg twice daily upon discharge. He felt okay on discharge, but subsequently felt weak, noticed edema over the past 2 days which prompted him coming to the ED.   Patient came to [place] to visit family for the holidays. Denies chest pain, palpitations. His blood pressures at home was 24P systolic today. He was placed on midodrine 15 mg 3 times daily for BP support.   His baseline blood pressure is usually in the low 80D systolic.  He was diagnosed with multiple myeloma in June 2021, currently on chemotherapy. 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   Follows up with amyloid specialist/cardiology at [facility].   His next chemo treatment is next week Friday. His baseline systolic blood pressure is in the low 90s.  ER Course In the ED, BP was 75/56, EKG showed sinus rhythm, occasional SVC, low QRS voltage. Creatinine was elevated at 7.8, troponin  1363. He was started on Levophed with improvement in blood pressure upon my exam to systolics of 93.  LA is 7  PAST MEDICAL HISTORY :   has a past medical history of Arthritis, Diabetes mellitus without complication ([place]), Glaucoma (increased eye pressure), Hyperlipidemia, Hypertension, and Sleep apnea.  has a past surgical history that includes Tonsillectomy; Appendectomy; and Lumbar laminectomy/decompression microdiscectomy (N/A, 06/19/2013). Prior to Admission medications     Medication Sig Start Date End Date Taking? Authorizing Provider  acetaminophen (TYLENOL) 500 MG tablet Take 500-1,000 mg by mouth every 6 (six) hours as needed for mild pain or fever.   Yes [provider]  acyclovir (ZOVIRAX) 800 MG tablet Take 800 mg by mouth daily. 10/05/19  Yes [provider]  allopurinol (ZYLOPRIM) 300 MG tablet Take 300 mg by mouth daily. 09/09/19  Yes [provider]  aMILoride (MIDAMOR) 5 MG tablet Take 10 mg by mouth 2 (two) times daily. 12/24/19  Yes [provider]  aspirin EC 81 MG tablet Take 81 mg by mouth daily.   Yes [provider]  atorvastatin (LIPITOR) 80 MG tablet Take 40 mg by mouth daily.    Yes [provider]  bumetanide (BUMEX) 2 MG tablet Take 4 mg by mouth 2 (two) times daily. 12/07/19  Yes [provider]  cholecalciferol (VITAMIN D) 25 MCG (1000 UNIT) tablet Take 1,000 Units by mouth daily. 09/09/19  Yes [provider]  cyclophosphamide (CYTOXAN) 50 MG capsule Take 350 mg by mouth every Friday.    Yes [provider]  dexamethasone (DECADRON) 4 MG tablet Take 8 mg by mouth every Friday. (30 to 60 minutes before chemo infusion)   Yes [provider]  doxycycline (VIBRAMYCIN) 100 MG capsule Take 100 mg by mouth 2 (two) times daily.   Yes [provider]  fluocinonide cream (LIDEX) 9.83 % Apply 1 application topically as directed. 11/13/19  Yes [provider]  latanoprost (XALATAN) 0.005 % ophthalmic solution Place 1 drop into both eyes at bedtime.   Yes [provider]  Melaton-LBalm-Cham-Lav-Brom (MIDNITE SLEEP AID PO) Take 1 tablet by mouth as directed.   Yes [provider]  midodrine (PROAMATINE) 10 MG tablet Take 15 mg by mouth 3 (three) times daily. 12/24/19  Yes [provider]  ondansetron (ZOFRAN) 4 MG tablet Take 4 mg by mouth 2 (two) times daily as needed for nausea or vomiting. 11/08/19  Yes [provider]  pantoprazole (PROTONIX) 40 MG tablet Take 40 mg by mouth daily.   Yes [provider]  tamsulosin (FLOMAX) 0.4 MG CAPS capsule Take 0.4 mg by mouth daily. 09/11/19  Yes [provider]  timolol (BETIMOL) 0.5 % ophthalmic solution Place 1 drop into both eyes daily.    Yes [provider]  triamcinolone (KENALOG) 0.1 % Apply 1 application topically 2 (two) times daily. 11/15/19  Yes [provider]   No Known Allergies  FAMILY HISTORY:  family history includes Diabetes in his sister, sister, sister, and sister; Heart disease in his brother and mother; Heart failure in his brother and mother; Hypertension in his mother; Stroke in his brother. SOCIAL HISTORY:  reports that he quit smoking about 6 years ago. His smoking use included cigarettes. He has a 75.00 pack-year smoking history. He has never used smokeless tobacco. He reports current alcohol use of about 10.0 standard drinks of alcohol per week. He reports that he does not use drugs.  REVIEW OF SYSTEMS: Positives in BOLD   Gen: poor po intake, fever, chills, weight change, fatigue, night sweats HEENT: Denies blurred vision, double vision, hearing loss, tinnitus, sinus congestion, rhinorrhea, sore throat, neck stiffness, dysphagia PULM: Denies shortness of breath, cough, sputum production, hemoptysis, wheezing CV: chest pain, edema, orthopnea, paroxysmal nocturnal dyspnea, palpitations GI: Denies abdominal pain, nausea, vomiting, diarrhea, hematochezia, melena, constipation, change in bowel habits GU: Denies dysuria, hematuria, polyuria, oliguria, urethral discharge Endocrine: Denies hot or cold intolerance, polyuria, polyphagia or appetite change Derm: Denies rash, dry skin, scaling or peeling skin change Heme: Denies easy bruising, bleeding, bleeding gums Neuro: headache, numbness, weakness, slurred speech, loss of memory or consciousness  Estimated body mass index is 29.95 kg/m as calculated from  the following:   Height as of this encounter: '6\' 1"'  (1.854 m).   Weight as of this encounter: 103 kg.  VITAL SIGNS: Temp:  [97.8 F (36.6 C)] 97.8 F (36.6 C) (11/24 1457) Pulse Rate:  [79-91] 91 (11/24 1700) Resp:  [11-22] 16 (11/24 1700) BP: (56-96)/(31-72) 94/64 (11/24 1700) SpO2:  [92 %-100 %] 100 % (11/24 1700) Weight:  [103 kg] 103 kg (11/24 1455)  No intake/output data recorded. Total I/O In: 500 [IV Piggyback:500] Out: 3 [Urine:3]  SpO2: 100 % O2 Flow Rate (L/min): 1 L/min  Physical Examination:  GENERAL:acutely ill appearing male, NAD resting in bed  HEAD: Normocephalic, atraumatic.  EYES: Pupils equal, round, reactive to light. scleral icterus present   MOUTH: dry mucosal membrane. NECK: JVD present  PULMONARY: faint crackles throughout, even, non labored  CARDIOVASCULAR: sinus rhythm, no R/G, +1 palpable distal pulses  GASTROINTESTINAL: +BS x4, taut, distended ascites present  MUSCULOSKELETAL: 2+ bilateral lower extremity pitting edema NEUROLOGIC: alert and oriented, follows commands, PERRLA  SKIN:intact,warm,dry  I personally reviewed lab work that was obtained in last 24 hrs.  MEDICATIONS: I have reviewed all medications and confirmed regimen as documented   CULTURE RESULTS   Recent Results (from the past 240 hour(s))  Resp Panel by RT-PCR (Flu A&B, Covid) Nasopharyngeal Swab     Status: None  Collection Time: 12/29/2019  3:11 PM   Specimen: Nasopharyngeal Swab; Nasopharyngeal(NP) swabs in vial transport medium  Result Value Ref Range Status   SARS Coronavirus 2 by RT PCR NEGATIVE NEGATIVE Final    Comment: (NOTE) SARS-CoV-2 target nucleic acids are NOT DETECTED.  The SARS-CoV-2 RNA is generally detectable in upper respiratory specimens during the acute phase of infection. The lowest concentration of SARS-CoV-2 viral copies this assay can detect is 138 copies/mL. A negative result does not preclude SARS-Cov-2 infection and should not be used as the  sole basis for treatment or other patient management decisions. A negative result may occur with  improper specimen collection/handling, submission of specimen other than nasopharyngeal swab, presence of viral mutation(s) within the areas targeted by this assay, and inadequate number of viral copies(<138 copies/mL). A negative result must be combined with clinical observations, patient history, and epidemiological information. The expected result is Negative.  Fact Sheet for Patients:  EntrepreneurPulse.com.au  Fact Sheet for Healthcare Providers:  IncredibleEmployment.be  This test is no t yet approved or cleared by the [place] FDA and  has been authorized for detection and/or diagnosis of SARS-CoV-2 by FDA under an Emergency Use Authorization (EUA). This EUA will remain  in effect (meaning this test can be used) for the duration of the COVID-19 declaration under Section 564(b)(1) of the Act, 21 U.S.C.section 360bbb-3(b)(1), unless the authorization is terminated  or revoked sooner.       Influenza A by PCR NEGATIVE NEGATIVE Final   Influenza B by PCR NEGATIVE NEGATIVE Final    Comment: (NOTE) The Xpert Xpress SARS-CoV-2/FLU/RSV plus assay is intended as an aid in the diagnosis of influenza from Nasopharyngeal swab specimens and should not be used as a sole basis for treatment. Nasal washings and aspirates are unacceptable for Xpert Xpress SARS-CoV-2/FLU/RSV testing.  Fact Sheet for Patients: EntrepreneurPulse.com.au  Fact Sheet for Healthcare Providers: IncredibleEmployment.be  This test is not yet approved or cleared by the [place] FDA and has been authorized for detection and/or diagnosis of SARS-CoV-2 by FDA under an Emergency Use Authorization (EUA). This EUA will remain in effect (meaning this test can be used) for the duration of the COVID-19 declaration under Section 564(b)(1) of the  Act, 21 U.S.C. section 360bbb-3(b)(1), unless the authorization is terminated or revoked.  Performed at [facility], [address] [place], [place] [address]           IMAGING    US Renal  Result Date: 12/22/2019 CLINICAL DATA:  Acute renal failure EXAM: RENAL / URINARY TRACT ULTRASOUND COMPLETE COMPARISON:  Ultrasound report kidney 12/09/2008 FINDINGS: Right Kidney: Renal measurements: 11.4 x 5.1 x 5.3 cm = volume: 161.2 mL. Cortex is echogenic. No mass. No hydronephrosis Left Kidney: Renal measurements: 11.7 x 6.2 x 5 cm = volume: 187.1 mL. Cortex is echogenic. There is mild hydronephrosis. Complex cystic mass at the upper pole measuring 1.6 x 1.4 x 1.3 cm with multiple septations. Bladder: Not well visualized and likely empty. Other: Suspect subtle contour nodularity of the liver. Small moderate perihepatic ascites. IMPRESSION: 1. Echogenic kidneys bilaterally suggesting medical renal disease. There is mild left hydronephrosis. 2. 1.6 cm complex septated cyst in the upper pole left kidney. When the patient is clinically stable and able to follow directions and hold their breath (preferably as an outpatient) further evaluation with dedicated abdominal MRI should be considered. 3. Possible cirrhotic morphology of liver. Small moderate perihepatic ascites Electronically Signed   By: Madie Reno.D.  On: 12/07/2019 17:34   DG Chest Port 1 View  Result Date: 12/31/2019 CLINICAL DATA:  Hypotension EXAM: PORTABLE CHEST 1 VIEW COMPARISON:  10/25/2013 FINDINGS: Mild cardiomegaly. Minimal atelectasis left base. No consolidation or effusion. No pneumothorax. IMPRESSION: Mild cardiomegaly. Minimal atelectasis at the left base. Electronically Signed   By: Donavan Foil M.D.   On: 12/24/2019 15:47   US ABDOMEN LIMITED RUQ (LIVER/GB)  Result Date: 12/25/2019 CLINICAL DATA:  69 year old male with hyperbilirubinemia. EXAM: ULTRASOUND ABDOMEN LIMITED RIGHT UPPER QUADRANT COMPARISON:   None. FINDINGS: Gallbladder: The gallbladder is filled with sludge. There is no gallbladder wall thickening. Negative sonographic Murphy's sign. Common bile duct: Diameter: 4 mm Liver: The liver demonstrates a coarsened echotexture. There is mild surface irregularity concerning for changes of cirrhosis. Portal vein is patent on color Doppler imaging with normal direction of blood flow towards the liver. Other: Small ascites.  Partially visualized right pleural effusion. IMPRESSION: 1. Sludge filled gallbladder without sonographic findings of acute cholecystitis. 2. Probable cirrhosis.  Clinical correlation is recommended. 3. Patent main portal vein with hepatopetal flow. 4. Small ascites and right pleural effusion. Electronically Signed   By: Anner Crete M.D.   On: 12/15/2019 17:32    ASSESSMENT/PLAN  OSA Supplemental O2 for dyspnea and/or hypoxia   Hypotension likely cardiogenic although will need to r/o sepsis  Elevated troponin's secondary to demand ischemia vs. NSTEMI  Hx: HTN, Cardiac Amyloid-AL, and HTN  Continuous telemetry monitoring  Continue midodrine and/or prn levophed and vasopressin gtts to maintain map >65  IV lasix as bp tolerates  Trend troponin's  Hold outpatient antihypertensives  Continue outpatient atorvastatin  Continue heparin gtt for now dosing per pharmacy  Cardiology consulted appreciate input   Acute on chronic renal failure with severe lactic acidosis   Trend BMP  Replace electrolytes as indicated  Monitor UOP  Avoid nephrotoxic medications  Will administer albumin x1 dose   Possible sepsis PCT ~4.24 Hx: Multiple Myeloma  Trend WBC, alk phos, and monitor fever curve  Trend PCT  Follow cultures  Will start empiric zosyn for possible intraabdominal infection due to elevated alk phos   Elevated liver enzymes and alk phos~US Abd limited 11/24 concerning for cirrhosis and sludge filled gallbladder without findings of acute cholecystitis   Trend hepatic  panel and alk phos  Will check acute hepatitis panel   Type II Diabetes Mellitus  CBG's q4hrs~if CBG's >140 will start SSI   Pt and pts wife updated regarding plan of care and all questions were answered will continue to monitor and assess pt   Marda Stalker, [place] Pager [phone] (please enter 7 digits) PCCM Consult Pager [phone] (please enter 7 digits)

## 2019-12-26 NOTE — Progress Notes (Signed)
eLink Physician-Brief Progress Note Patient Name: Mason Johnson DOB: [birth year] MRN: [id number]   Date of Service  12/12/2019  HPI/Events of Note  39 M previous smoker, DM, hypertension, dyslipidemia, multiple myeloma Dx July 2021 on Daratumumab, Velcade, Cytoxan, dexamethasone q 4 weeks, cardiac and hepatic amyloidosis with recent admission for volume overload and was diuresed. Developed AKI requiring temporary CRRT with recovery of renal function. He was discharged on Bumex.  He presents this time due to edema and weakness. Hypotensive started on midodrine now requiring norepinephrine. AKI creatinine 6.15. Procal 4.24, lactic acid 7.5, Trop I 1382  eICU Interventions  Started on piperacillin tazobactam and heparin drip. Cultures pending. Unable to aggressively diurese due to hypotension. Cardiology consulted AKI, given albumin. May need CRRT again     Intervention Category Major Interventions: Hypotension - evaluation and management Evaluation Type: New Patient Evaluation  Judd Lien 12/12/2019, 9:16 PM

## 2019-12-26 NOTE — ED Notes (Signed)
RN Lana informed of pt coming to room.

## 2019-12-26 NOTE — ED Provider Notes (Signed)
[facility] Emergency Department Provider Note  ____________________________________________   First MD Initiated Contact with Patient 12/04/2019 1503     (approximate)  I have reviewed the triage vital signs and the nursing notes.   HISTORY  Chief Complaint Hypotension   HPI Mason Johnson is a 69 y.o. male with a past medical history of CHF, arthritis, DM, HTN, HDL, OSA, glaucoma, and amyloidosis who presents for assessment of generalized malaise and weakness associate with some nonbloody nonbilious vomiting and low blood pressure taken by family at home.  Patient states he has been feeling poorly for several months but feels little bit worse today.  He states he has been taking all of his medicines as directed and denies any recent falls or injuries, fevers, chills, chest pain, cough, shortness of breath, diarrhea, dysuria, rash, blood in stool, blood in his urine, extremity pain or weakness or numbness or other acute physical complaints.  He denies any recent falls or injuries.  Denies EtOH use, illicit drug use, tobacco abuse.  No clear alleviating or aggravating factors.  Patient is from out of town and is in this area visiting family for Thanksgiving holiday.         Past Medical History:  Diagnosis Date  . Arthritis   . Diabetes mellitus without complication ([place])   . Glaucoma (increased eye pressure)   . Hyperlipidemia   . Hypertension   . Sleep apnea    cpap occasionally    Patient Active Problem List   Diagnosis Date Noted  . Primary localized osteoarthritis of pelvic region and thigh 11/25/2017  . Acquired trigger finger 11/25/2017  . Localized swelling, mass and lump, neck 04/27/2016  . HLD (hyperlipidemia) 08/29/2014  . BP (high blood pressure) 08/29/2014  . Lumbar spinal stenosis 06/19/2013    Past Surgical History:  Procedure Laterality Date  . APPENDECTOMY    . LUMBAR LAMINECTOMY/DECOMPRESSION MICRODISCECTOMY N/A 06/19/2013    Procedure: LUMBAR LAMINECTOMY/DECOMPRESSION MICRODISCECTOMY 3 LEVELS  lumbar two/three, three/four, four/five;  Surgeon: Consuella Lose, MD;  Location: [place] [facility];  Service: Neurosurgery;  Laterality: N/A;  . TONSILLECTOMY      Prior to Admission medications   Medication Sig Start Date End Date Taking? Authorizing Provider  acetaminophen (TYLENOL) 500 MG tablet Take 500-1,000 mg by mouth every 6 (six) hours as needed for mild pain or fever.   Yes [provider]  acyclovir (ZOVIRAX) 800 MG tablet Take 800 mg by mouth daily. 10/05/19  Yes [provider]  allopurinol (ZYLOPRIM) 300 MG tablet Take 300 mg by mouth daily. 09/09/19  Yes [provider]  aMILoride (MIDAMOR) 5 MG tablet Take 10 mg by mouth 2 (two) times daily. 12/24/19  Yes [provider]  atorvastatin (LIPITOR) 80 MG tablet Take 40 mg by mouth daily.    Yes [provider]  bumetanide (BUMEX) 2 MG tablet Take 4 mg by mouth 2 (two) times daily. 12/07/19  Yes [provider]  cholecalciferol (VITAMIN D) 25 MCG (1000 UNIT) tablet Take 1,000 Units by mouth daily. 09/09/19  Yes [provider]  cyclophosphamide (CYTOXAN) 50 MG capsule Take 350 mg by mouth every Friday.    Yes [provider]  doxycycline (VIBRAMYCIN) 100 MG capsule Take 100 mg by mouth 2 (two) times daily.   Yes [provider]  fluocinonide cream (LIDEX) 7.18 % Apply 1 application topically as directed. 11/13/19  Yes [provider]  latanoprost (XALATAN) 0.005 % ophthalmic solution Place 1 drop into both  eyes at bedtime.   Yes [provider]  Melaton-LBalm-Cham-Lav-Brom (MIDNITE SLEEP AID PO) Take 1 tablet by mouth as directed.   Yes [provider]  midodrine (PROAMATINE) 10 MG tablet Take 15 mg by mouth 3 (three) times daily. 12/24/19  Yes [provider]  ondansetron (ZOFRAN) 4 MG tablet Take 4 mg by mouth 2 (two) times daily as needed for nausea or  vomiting. 11/08/19  Yes [provider]  pantoprazole (PROTONIX) 40 MG tablet Take 40 mg by mouth daily.   Yes [provider]  potassium chloride SA (KLOR-CON) 20 MEQ tablet Take 20 mEq by mouth daily.   Yes [provider]  tamsulosin (FLOMAX) 0.4 MG CAPS capsule Take 0.4 mg by mouth daily. 09/11/19  Yes [provider]  timolol (BETIMOL) 0.5 % ophthalmic solution Place 1 drop into both eyes daily.    Yes [provider]  triamcinolone (KENALOG) 0.1 % Apply 1 application topically 2 (two) times daily. 11/15/19  Yes [provider]  aspirin EC 81 MG tablet Take 81 mg by mouth daily.    [provider]    Allergies Patient has no known allergies.  Family History  Problem Relation Age of Onset  . Diabetes Sister   . Diabetes Sister   . Diabetes Sister   . Diabetes Sister   . Hypertension Mother   . Heart disease Mother   . Heart failure Mother   . Stroke Brother   . Heart failure Brother   . Heart disease Brother     Social History Social History   Tobacco Use  . Smoking status: Former Smoker    Packs/day: 1.50    Years: 50.00    Pack years: 75.00    Types: Cigarettes    Quit date: 02/11/2013    Years since quitting: 6.8  . Smokeless tobacco: Never Used  Substance Use Topics  . Alcohol use: Yes    Alcohol/week: 10.0 standard drinks    Types: 3 Cans of beer, 7 Shots of liquor per week    Comment: daily cocktails 3-4  . Drug use: No    Review of Systems  Review of Systems  Constitutional: Positive for malaise/fatigue. Negative for chills and fever.  HENT: Negative for sore throat.   Eyes: Negative for pain.  Respiratory: Negative for cough and stridor.   Cardiovascular: Negative for chest pain.  Gastrointestinal: Positive for nausea and vomiting.  Genitourinary: Negative for dysuria.  Musculoskeletal: Negative for myalgias.  Skin: Negative for rash.  Neurological: Negative for seizures, loss of  consciousness and headaches.  Psychiatric/Behavioral: Negative for suicidal ideas.  All other systems reviewed and are negative.     ____________________________________________   PHYSICAL EXAM:  VITAL SIGNS: ED Triage Vitals  Enc Vitals Group     BP 12/28/2019 1457 (!) 63/31     Pulse Rate 12/20/2019 1457 87     Resp 12/18/2019 1457 16     Temp 12/08/2019 1457 97.8 F (36.6 C)     Temp Source 12/06/2019 1457 Oral     SpO2 12/27/2019 1457 98 %     Weight 12/28/2019 1455 227 lb (103 kg)     Height 12/10/2019 1455 '6\' 1"'  (1.854 m)     Head Circumference --      Peak Flow --      Pain Score 12/25/2019 1455 0     Pain Loc --      Pain Edu? --      Excl. in [place]? --  Vitals:   12/29/2019 1645 12/09/2019 1700  BP: 96/66 94/64  Pulse: 89 91  Resp: 20 16  Temp:    SpO2: 100% 100%   Physical Exam Vitals and nursing note reviewed.  Constitutional:      General: He is in acute distress.     Appearance: He is well-developed. He is ill-appearing.  HENT:     Head: Normocephalic and atraumatic.     Right Ear: External ear normal.     Left Ear: External ear normal.     Nose: Nose normal.     Mouth/Throat:     Mouth: Mucous membranes are dry.  Eyes:     Conjunctiva/sclera: Conjunctivae normal.  Cardiovascular:     Rate and Rhythm: Normal rate. Rhythm irregular.     Heart sounds: No murmur heard.   Pulmonary:     Effort: Pulmonary effort is normal. No respiratory distress.     Breath sounds: Normal breath sounds.  Abdominal:     General: There is distension.     Palpations: Abdomen is soft.     Tenderness: There is no abdominal tenderness.  Musculoskeletal:     Cervical back: Neck supple.     Right lower leg: Edema present.     Left lower leg: Edema present.  Skin:    General: Skin is warm and dry.     Capillary Refill: Capillary refill takes more than 3 seconds.  Neurological:     Mental Status: He is alert and oriented to person, place, and time.  Psychiatric:        Mood and  Affect: Mood normal.      ____________________________________________   LABS (all labs ordered are listed, but only abnormal results are displayed)  Labs Reviewed  BASIC METABOLIC PANEL - Abnormal; Notable for the following components:      Result Value   Sodium 127 (*)    Chloride 87 (*)    CO2 21 (*)    BUN 64 (*)    Creatinine, Ser 6.15 (*)    Calcium 7.8 (*)    GFR, Estimated 9 (*)    Anion gap 19 (*)    All other components within normal limits  CBC - Abnormal; Notable for the following components:   Hemoglobin 12.5 (*)    Platelets 132 (*)    All other components within normal limits  URINALYSIS, COMPLETE (UACMP) WITH MICROSCOPIC - Abnormal; Notable for the following components:   Color, Urine AMBER (*)    APPearance TURBID (*)    Glucose, UA 50 (*)    Hgb urine dipstick SMALL (*)    Bilirubin Urine SMALL (*)    Protein, ur 100 (*)    Bacteria, UA RARE (*)    All other components within normal limits  BRAIN NATRIURETIC PEPTIDE - Abnormal; Notable for the following components:   B Natriuretic Peptide 918.3 (*)    All other components within normal limits  LACTIC ACID, PLASMA - Abnormal; Notable for the following components:   Lactic Acid, Venous 7.1 (*)    All other components within normal limits  PROTIME-INR - Abnormal; Notable for the following components:   Prothrombin Time 16.5 (*)    INR 1.4 (*)    All other components within normal limits  APTT - Abnormal; Notable for the following components:   aPTT 53 (*)    All other components within normal limits  HEPATIC FUNCTION PANEL - Abnormal; Notable for the following components:   Total Protein 4.2 (*)  Albumin 1.8 (*)    AST 195 (*)    ALT 81 (*)    Alkaline Phosphatase 413 (*)    Total Bilirubin 7.6 (*)    Bilirubin, Direct 4.7 (*)    Indirect Bilirubin 2.9 (*)    All other components within normal limits  BLOOD GAS, VENOUS - Abnormal; Notable for the following components:   pH, Ven 7.44 (*)     pCO2, Ven 32 (*)    All other components within normal limits  TROPONIN I (HIGH SENSITIVITY) - Abnormal; Notable for the following components:   Troponin I (High Sensitivity) 1,363 (*)    All other components within normal limits  RESP PANEL BY RT-PCR (FLU A&B, COVID) ARPGX2  CULTURE, BLOOD (ROUTINE X 2)  CULTURE, BLOOD (ROUTINE X 2)  URINE CULTURE  MAGNESIUM  TSH  PROCALCITONIN  LACTIC ACID, PLASMA  PROCALCITONIN  TROPONIN I (HIGH SENSITIVITY)   ____________________________________________  EKG  A. fib with a ventricular rate of 85, prolonged QTc interval at 634, very poor amplitude throughout and diffuse nonspecific ST changes. ____________________________________________  RADIOLOGY  ED MD interpretation: Mild cardiomegaly without clear focal consolidation or significant pulmonary edema.  No other clear acute thoracic process.  Official radiology report(s): DG Chest Port 1 View  Result Date: 12/14/2019 CLINICAL DATA:  Hypotension EXAM: PORTABLE CHEST 1 VIEW COMPARISON:  10/25/2013 FINDINGS: Mild cardiomegaly. Minimal atelectasis left base. No consolidation or effusion. No pneumothorax. IMPRESSION: Mild cardiomegaly. Minimal atelectasis at the left base. Electronically Signed   By: Donavan Foil M.D.   On: 12/22/2019 15:47    ____________________________________________   PROCEDURES  Procedure(s) performed (including Critical Care):  .Critical Care Performed by: Lucrezia Starch, MD Authorized by: Lucrezia Starch, MD   Critical care provider statement:    Critical care time (minutes):  45   Critical care time was exclusive of:  Separately billable procedures and treating other patients   Critical care was necessary to treat or prevent imminent or life-threatening deterioration of the following conditions:  Shock, circulatory failure, renal failure, metabolic crisis and cardiac failure   Critical care was time spent personally by me on the following activities:   Discussions with consultants, evaluation of patient's response to treatment, examination of patient, ordering and performing treatments and interventions, ordering and review of laboratory studies, ordering and review of radiographic studies, pulse oximetry, re-evaluation of patient's condition, obtaining history from patient or surrogate and review of old charts     ____________________________________________   INITIAL IMPRESSION / [place] / ED COURSE        Patient presents with [place] to history exam for assessment of general malaise as well as nausea and vomiting and low blood pressure taken by family at home prior to arrival.  On arrival patient is hypotensive with a BP of 63/31 with otherwise unremarkable vital signs on room air.  Differential includes but is not limited to septic versus hypovolemic versus cardiogenic versus obstructive neurogenic shock.  While patient does endorse some vomiting he does appear grossly volume overloaded on exam initially of lower suspicion for hypovolemic shock then cardiogenic versus obstructive shock given he does have a history of CHF and amyloid.  Lower suspicion at this time for septic shock given absence of fever, elevated white blood cell count or other clear foci of infection.  No evidence of pneumonia on chest x-ray and UA does not appear infected.  BMP remarkable for hyponatremia as well as evidence of acute renal failure with creatinine of  6.15 compared to 1.25 years ago and anion gap acidosis with bicarb of 21 and a anion gap of 19.  Patient sodium is 127 and his chloride is 87.  CBC remarkable for WBC count of 14.4, hemoglobin of 12.5, and platelets of 132.  BNP is elevated 918.  Initial troponin is elevated at 1363 which is treating with cardiogenic shock and elevated BNP.  Initial lactic acid of 7.1 is consistent with hypotension and indicative of poor extremity perfusion on arrival.  Hepatic function panel is remarkable for  transaminitis with AST of 195 and ALT of 81 as well as elevated alk phos at 413 and T bili of 7.9 with direct bili of 4.7 and indirect of 3.9.  Patient denies any abdominal pain will obtain right upper quadrant ultrasound to assess for evidence of acute intra-abdominal infectious process i.e. cholecystitis or colitis.  TSH is unremarkable.  Lower suspicion for neurogenic shock at this time given no history of recent trauma.  [place] on-call cardiologist was consulted with concerns for possible cardiogenic shock.  He stated would come and see the patient make any additional recommendations.  In addition because I am unable to exclude septic shock cultures were obtained patient was given broad-spectrum antibiotics.  Given concern for cardiogenic shock is slightly more likely in this time than septic patient initially started on Levophed and only given a small bolus of IV fluids.  He was started on heparin for NSTEMI and given aspirin by mouth.  Given patient is on pressors and has evidence of shock with acute endorgan damage i.e. renal failure and possible ischemia he will be admitted to medical ICU for further evaluation management. ____________________________________________   FINAL CLINICAL IMPRESSION(S) / ED DIAGNOSES  Final diagnoses:  Bilirubinemia  Shock ([place])  NSTEMI (non-ST elevated myocardial infarction) ([place])  Atrial fibrillation, unspecified type ([place])  Hypotension, unspecified hypotension type  Hyponatremia  Hypochloremia  Metabolic acidosis, increased anion gap  Elevated brain natriuretic peptide (BNP) level  Transaminitis  AKI (acute kidney injury) ([place])    Medications  norepinephrine (LEVOPHED) 65m in 257mpremix infusion (35 mcg/min Intravenous Rate/Dose Change 12/15/2019 1627)  heparin bolus via infusion 4,000 Units (4,000 Units Intravenous Bolus from Bag 12/05/2019 1708)    Followed by  heparin ADULT infusion 100 units/mL (25000 units/25024modium chloride 0.45%)  (1,350 Units/hr Intravenous New Bag/Given 01/01/2020 1708)  lactated ringers bolus 500 mL (0 mLs Intravenous Stopped 12/29/2019 1645)  aspirin chewable tablet 324 mg (324 mg Oral Given 12/18/2019 1635)  ceFEPIme (MAXIPIME) 2 g in sodium chloride 0.9 % 100 mL IVPB (0 g Intravenous Stopped 12/20/2019 1703)     ED Discharge Orders    None       Note:  This document was prepared using Dragon voice recognition software and may include unintentional dictation errors.   SmiLucrezia StarchD 12/28/2019 [id number]

## 2019-12-26 NOTE — Procedures (Signed)
Central Venous Catheter Insertion Procedure Note  Mason Johnson  [id number]  [birth year]  Date:12/03/2019  Time:8:22 PM   Provider Performing:Karletta Millay Shauna Hugh   Procedure: Insertion of Non-tunneled Central Venous [phone]) with US guidance ([id number])   Indication(s) Medication administration  Consent Risks of the procedure as well as the alternatives and risks of each were explained to the patient and/or caregiver.  Consent for the procedure was obtained and is signed in the bedside chart  Anesthesia Topical only with 1% lidocaine   Timeout Verified patient identification, verified procedure, site/side was marked, verified correct patient position, special equipment/implants available, medications/allergies/relevant history reviewed, required imaging and test results available.  Sterile Technique Maximal sterile technique including full sterile barrier drape, hand hygiene, sterile gown, sterile gloves, mask, hair covering, sterile ultrasound probe cover (if used).  Procedure Description Area of catheter insertion was cleaned with chlorhexidine and draped in sterile fashion.  With real-time ultrasound guidance a central venous catheter was placed into the left internal jugular vein. Nonpulsatile blood flow and easy flushing noted in all ports.  The catheter was sutured in place and sterile dressing applied.  Complications/Tolerance None; patient tolerated the procedure well. Chest X-ray is ordered to verify placement for internal jugular or subclavian cannulation.   Chest x-ray is not ordered for femoral cannulation.  EBL Minimal  Specimen(s) None  Marda Stalker, [place] Pager [phone] (please enter 7 digits) PCCM Consult Pager [phone] (please enter 7 digits)

## 2019-12-27 ENCOUNTER — Encounter: Payer: Self-pay | Admitting: Internal Medicine

## 2019-12-27 ENCOUNTER — Inpatient Hospital Stay: Payer: Medicare Other

## 2019-12-27 DIAGNOSIS — E854 Organ-limited amyloidosis: Secondary | ICD-10-CM

## 2019-12-27 DIAGNOSIS — I214 Non-ST elevation (NSTEMI) myocardial infarction: Secondary | ICD-10-CM | POA: Diagnosis not present

## 2019-12-27 DIAGNOSIS — R6520 Severe sepsis without septic shock: Secondary | ICD-10-CM

## 2019-12-27 DIAGNOSIS — I43 Cardiomyopathy in diseases classified elsewhere: Secondary | ICD-10-CM | POA: Diagnosis not present

## 2019-12-27 LAB — GLUCOSE, CAPILLARY
Glucose-Capillary: 106 mg/dL — ABNORMAL HIGH (ref 70–99)
Glucose-Capillary: 110 mg/dL — ABNORMAL HIGH (ref 70–99)
Glucose-Capillary: 112 mg/dL — ABNORMAL HIGH (ref 70–99)
Glucose-Capillary: 128 mg/dL — ABNORMAL HIGH (ref 70–99)
Glucose-Capillary: 129 mg/dL — ABNORMAL HIGH (ref 70–99)
Glucose-Capillary: 144 mg/dL — ABNORMAL HIGH (ref 70–99)
Glucose-Capillary: 160 mg/dL — ABNORMAL HIGH (ref 70–99)

## 2019-12-27 LAB — BASIC METABOLIC PANEL
Anion gap: 19 — ABNORMAL HIGH (ref 5–15)
BUN: 67 mg/dL — ABNORMAL HIGH (ref 8–23)
CO2: 19 mmol/L — ABNORMAL LOW (ref 22–32)
Calcium: 7.4 mg/dL — ABNORMAL LOW (ref 8.9–10.3)
Chloride: 85 mmol/L — ABNORMAL LOW (ref 98–111)
Creatinine, Ser: 6.19 mg/dL — ABNORMAL HIGH (ref 0.61–1.24)
GFR, Estimated: 9 mL/min — ABNORMAL LOW (ref >60)
Glucose, Bld: 133 mg/dL — ABNORMAL HIGH (ref 70–99)
Potassium: 4.9 mmol/L (ref 3.5–5.1)
Sodium: 123 mmol/L — ABNORMAL LOW (ref 135–145)

## 2019-12-27 LAB — CBC WITH DIFFERENTIAL/PLATELET
Abs Immature Granulocytes: 0.05 10*3/uL (ref 0.00–0.07)
Basophils Absolute: 0 10*3/uL (ref 0.0–0.1)
Basophils Relative: 0 %
Eosinophils Absolute: 0 10*3/uL (ref 0.0–0.5)
Eosinophils Relative: 0 %
Hemoglobin: 12.2 g/dL — ABNORMAL LOW (ref 13.0–17.0)
Immature Granulocytes: 0 %
Lymphocytes Relative: 0 %
Lymphs Abs: 0 10*3/uL — ABNORMAL LOW (ref 0.7–4.0)
Monocytes Absolute: 2.1 10*3/uL — ABNORMAL HIGH (ref 0.1–1.0)
Monocytes Relative: 18 %
Neutro Abs: 10 10*3/uL — ABNORMAL HIGH (ref 1.7–7.7)
Neutrophils Relative %: 82 %
Platelets: 137 10*3/uL — ABNORMAL LOW (ref 150–400)
Smear Review: NORMAL
WBC: 12.2 10*3/uL — ABNORMAL HIGH (ref 4.0–10.5)
nRBC: 2.2 % — ABNORMAL HIGH (ref 0.0–0.2)

## 2019-12-27 LAB — HEPATITIS PANEL, ACUTE
HCV Ab: NONREACTIVE
Hep A IgM: NONREACTIVE
Hep B C IgM: NONREACTIVE
Hepatitis B Surface Ag: NONREACTIVE

## 2019-12-27 LAB — COMPREHENSIVE METABOLIC PANEL
ALT: 94 U/L — ABNORMAL HIGH (ref 0–44)
AST: 244 U/L — ABNORMAL HIGH (ref 15–41)
Albumin: 2 g/dL — ABNORMAL LOW (ref 3.5–5.0)
Alkaline Phosphatase: 464 U/L — ABNORMAL HIGH (ref 38–126)
Anion gap: 21 — ABNORMAL HIGH (ref 5–15)
BUN: 66 mg/dL — ABNORMAL HIGH (ref 8–23)
CO2: 18 mmol/L — ABNORMAL LOW (ref 22–32)
Calcium: 7.3 mg/dL — ABNORMAL LOW (ref 8.9–10.3)
Chloride: 85 mmol/L — ABNORMAL LOW (ref 98–111)
Creatinine, Ser: 6.19 mg/dL — ABNORMAL HIGH (ref 0.61–1.24)
GFR, Estimated: 9 mL/min — ABNORMAL LOW (ref >60)
Glucose, Bld: 121 mg/dL — ABNORMAL HIGH (ref 70–99)
Potassium: 4.9 mmol/L (ref 3.5–5.1)
Sodium: 124 mmol/L — ABNORMAL LOW (ref 135–145)
Total Bilirubin: 8.7 mg/dL — ABNORMAL HIGH (ref 0.3–1.2)
Total Protein: 4.7 g/dL — ABNORMAL LOW (ref 6.5–8.1)

## 2019-12-27 LAB — TROPONIN I (HIGH SENSITIVITY)
Troponin I (High Sensitivity): 2520 ng/L (ref <18)
Troponin I (High Sensitivity): 2584 ng/L (ref <18)
Troponin I (High Sensitivity): 2724 ng/L (ref <18)

## 2019-12-27 LAB — CORTISOL-AM, BLOOD: Cortisol - AM: 46.3 ug/dL — ABNORMAL HIGH (ref 6.7–22.6)

## 2019-12-27 LAB — BRAIN NATRIURETIC PEPTIDE: B Natriuretic Peptide: 1349.3 pg/mL — ABNORMAL HIGH (ref 0.0–100.0)

## 2019-12-27 LAB — BLOOD GAS, VENOUS
Acid-base deficit: 2.3 mmol/L — ABNORMAL HIGH (ref 0.0–2.0)
Bicarbonate: 20.6 mmol/L (ref 20.0–28.0)
O2 Saturation: 88.4 %
Patient temperature: 37
pCO2, Ven: 29 mmHg — ABNORMAL LOW (ref 44.0–60.0)
pH, Ven: 7.46 — ABNORMAL HIGH (ref 7.250–7.430)
pO2, Ven: 52 mmHg — ABNORMAL HIGH (ref 32.0–45.0)

## 2019-12-27 LAB — MAGNESIUM: Magnesium: 2.2 mg/dL (ref 1.7–2.4)

## 2019-12-27 LAB — ECHOCARDIOGRAM COMPLETE
Area-P 1/2: 3.72 cm2
Height: 73 in
S' Lateral: 2.5 cm
Weight: 3632 oz

## 2019-12-27 LAB — LACTIC ACID, PLASMA: Lactic Acid, Venous: 7.3 mmol/L (ref 0.5–1.9)

## 2019-12-27 LAB — HEPARIN LEVEL (UNFRACTIONATED)
Heparin Unfractionated: 0.22 IU/mL — ABNORMAL LOW (ref 0.30–0.70)
Heparin Unfractionated: 0.33 IU/mL (ref 0.30–0.70)
Heparin Unfractionated: 0.34 IU/mL (ref 0.30–0.70)

## 2019-12-27 LAB — PHOSPHORUS: Phosphorus: 6.5 mg/dL — ABNORMAL HIGH (ref 2.5–4.6)

## 2019-12-27 MED ORDER — FAMOTIDINE IN NACL 20-0.9 MG/50ML-% IV SOLN
20.0000 mg | INTRAVENOUS | Status: DC
  Administered 2019-12-28 – 2019-12-30 (×2): 20 mg via INTRAVENOUS
  Filled 2019-12-27 (×2): qty 50

## 2019-12-27 MED ORDER — PIPERACILLIN-TAZOBACTAM 3.375 G IVPB
3.3750 g | Freq: Two times a day (BID) | INTRAVENOUS | Status: DC
  Administered 2019-12-27: 3.375 g via INTRAVENOUS
  Filled 2019-12-27 (×2): qty 50

## 2019-12-27 MED ORDER — SODIUM CHLORIDE 0.9 % IV BOLUS
250.0000 mL | Freq: Once | INTRAVENOUS | Status: AC
  Administered 2019-12-27: 250 mL via INTRAVENOUS

## 2019-12-27 MED ORDER — SODIUM BICARBONATE 8.4 % IV SOLN
50.0000 meq | Freq: Once | INTRAVENOUS | Status: AC
  Administered 2019-12-27: 50 meq via INTRAVENOUS
  Filled 2019-12-27: qty 50

## 2019-12-27 NOTE — Consult Note (Signed)
[facility]  CONSULT NOTE    Date: 12/27/2019                  Patient Name:  Mason Johnson  MRN: [id number]  DOB: [birth year]  Age / Sex: 69 y.o., male         PCP: Arnetha Courser, MD                 Service Requesting Consult: Dr. Mortimer Fries                 Reason for Consult: Acute kidney injury            History of Present Illness: Mason Johnson admitted yesterday with peripheral edema and shortness of breath. Patient was recently discharged from [facility] in [place] from 11/15 to 11/21 for acute exacerbation of congestive heart failure and AL amyloidosis.Marland Kitchen He was discharged on bumetanide 57m PO bid. He has gained over 12 pounds since his discharge.   Wife at bedside who assists with history taking. Patient states he has had CRRT in the past and tolerated it well.   Today patient complains of increasing abdominal girth and peripheral edema. He states he cannot urinate and it hurts in his urethra when he tries to.    Medications: Outpatient medications: Medications Prior to Admission  Medication Sig Dispense Refill Last Dose  . acetaminophen (TYLENOL) 500 MG tablet Take 500-1,000 mg by mouth every 6 (six) hours as needed for mild pain or fever.   Unknown at PRN  . acyclovir (ZOVIRAX) 800 MG tablet Take 800 mg by mouth daily.     .Marland Kitchenallopurinol (ZYLOPRIM) 300 MG tablet Take 300 mg by mouth daily.   12/03/2019 at 0700  . aMILoride (MIDAMOR) 5 MG tablet Take 10 mg by mouth 2 (two) times daily.   12/06/2019 at 0700  . aspirin EC 81 MG tablet Take 81 mg by mouth daily.     .Marland Kitchenatorvastatin (LIPITOR) 80 MG tablet Take 40 mg by mouth daily.      . bumetanide (BUMEX) 2 MG tablet Take 4 mg by mouth 2 (two) times daily.   12/19/2019 at 0700  . cholecalciferol (VITAMIN D) 25 MCG (1000 UNIT) tablet Take 1,000 Units by mouth daily.     . cyclophosphamide (CYTOXAN) 50 MG capsule Take 350 mg by mouth every Friday.      .Marland Kitchendexamethasone  (DECADRON) 4 MG tablet Take 8 mg by mouth every Friday. (30 to 60 minutes before chemo infusion)     . doxycycline (VIBRAMYCIN) 100 MG capsule Take 100 mg by mouth 2 (two) times daily.   12/05/2019 at 0700  . fluocinonide cream (LIDEX) 00.25% Apply 1 application topically as directed.     . latanoprost (XALATAN) 0.005 % ophthalmic solution Place 1 drop into both eyes at bedtime.     . Melaton-LBalm-Cham-Lav-Brom (MIDNITE SLEEP AID PO) Take 1 tablet by mouth as directed.     . midodrine (PROAMATINE) 10 MG tablet Take 15 mg by mouth 3 (three) times daily.   12/04/2019 at 0700  . ondansetron (ZOFRAN) 4 MG tablet Take 4 mg by mouth 2 (two) times daily as needed for nausea or vomiting.   Unknown at PRN  . pantoprazole (PROTONIX) 40 MG tablet Take 40 mg by mouth daily.   12/08/2019 at 0700  . tamsulosin (FLOMAX) 0.4 MG CAPS capsule Take 0.4 mg by mouth daily.     . timolol (  BETIMOL) 0.5 % ophthalmic solution Place 1 drop into both eyes daily.      Marland Kitchen triamcinolone (KENALOG) 0.1 % Apply 1 application topically 2 (two) times daily.       Current medications: Current Facility-Administered Medications  Medication Dose Route Frequency Provider Last Rate Last Admin  . 0.9 %  sodium chloride infusion  250 mL Intravenous PRN Flora Lipps, MD      . acetaminophen (TYLENOL) tablet 650 mg  650 mg Oral Q4H PRN Flora Lipps, MD      . atorvastatin (LIPITOR) tablet 40 mg  40 mg Oral Daily Awilda Bill, NP      . Chlorhexidine Gluconate Cloth 2 % PADS 6 each  6 each Topical Daily Flora Lipps, MD   6 each at 12/29/2019 1929  . docusate sodium (COLACE) capsule 100 mg  100 mg Oral BID PRN Flora Lipps, MD      . famotidine (PEPCID) IVPB 20 mg premix  20 mg Intravenous Q12H Flora Lipps, MD   Stopped at 12/04/2019 2226  . fluocinonide cream (LIDEX) 2.54 % 1 application  1 application Topical UD Blakeney, Dreama Saa, NP      . heparin ADULT infusion 100 units/mL (25000 units/273m sodium chloride 0.45%)  1,600 Units/hr  Intravenous Continuous HHart RobinsonsA, RPH 16 mL/hr at 12/27/19 0700 1,600 Units/hr at 12/27/19 0700  . hydrocortisone sodium succinate (SOLU-CORTEF) 100 MG injection 50 mg  50 mg Intravenous Q6H BAwilda Bill NP   50 mg at 12/27/19 0543  . latanoprost (XALATAN) 0.005 % ophthalmic solution 1 drop  1 drop Both Eyes QHS BAwilda Bill NP   1 drop at 12/14/2019 2339  . lidocaine (XYLOCAINE) 1 % (with pres) injection 5 mL  5 mL Intradermal Once BAwilda Bill NP      . midodrine (PROAMATINE) tablet 15 mg  15 mg Oral TID WC AKate Sable MD   15 mg at 12/27/19 0732  . norepinephrine (LEVOPHED) 16 mg in 2529mpremix infusion  0-40 mcg/min Intravenous Continuous BlAwilda BillNP 37.5 mL/hr at 12/27/19 0700 40 mcg/min at 12/27/19 0700  . ondansetron (ZOFRAN) injection 4 mg  4 mg Intravenous Q6H PRN KaFlora LippsMD      . pantoprazole (PROTONIX) EC tablet 40 mg  40 mg Oral Daily BlAwilda BillNP      . phenylephrine CONCENTRATED 1003mn sodium chloride 0.9% 250m63m.4mg/29m infusion  0-400 mcg/min Intravenous Titrated BlakeAwilda Bill60 mL/hr at 12/27/19 0700 400 mcg/min at 12/27/19 0700  . piperacillin-tazobactam (ZOSYN) IVPB 2.25 g  2.25 g Intravenous Q8H ChappBenita Gutter  [facility], Theopped at 12/31/2019 2318  . polyethylene glycol (MIRALAX / GLYCOLAX) packet 17 g  17 g Oral Daily PRN Kasa,Flora Lipps     . sodium chloride flush (NS) 0.9 % injection 3 mL  3 mL Intravenous Q12H Kasa,Flora Lipps  3 mL at 12/04/2019 2249  . sodium chloride flush (NS) 0.9 % injection 3 mL  3 mL Intravenous PRN Kasa,Flora Lipps     . tamsulosin (FLOMAX) capsule 0.4 mg  0.4 mg Oral Daily BlakeAwilda Bill     . vasopressin (PITRESSIN) 20 Units in sodium chloride 0.9 % 100 mL infusion-*FOR SHOCK*  0-0.04 Units/min Intravenous Continuous BlakeAwilda Bill12 mL/hr at 12/27/19 0700 0.04 Units/min at 12/27/19 0700      Allergies: No Known Allergies    Past Medical History: Past Medical  History:   Diagnosis Date  . Arthritis   . Diabetes mellitus without complication ([place])   . Glaucoma (increased eye pressure)   . Hyperlipidemia   . Hypertension   . Sleep apnea    cpap occasionally     Past Surgical History: Past Surgical History:  Procedure Laterality Date  . APPENDECTOMY    . LUMBAR LAMINECTOMY/DECOMPRESSION MICRODISCECTOMY N/A 06/19/2013   Procedure: LUMBAR LAMINECTOMY/DECOMPRESSION MICRODISCECTOMY 3 LEVELS  lumbar two/three, three/four, four/five;  Surgeon: Consuella Lose, MD;  Location: [facility];  Service: Neurosurgery;  Laterality: N/A;  . TONSILLECTOMY       Family History: Family History  Problem Relation Age of Onset  . Diabetes Sister   . Diabetes Sister   . Diabetes Sister   . Diabetes Sister   . Hypertension Mother   . Heart disease Mother   . Heart failure Mother   . Stroke Brother   . Heart failure Brother   . Heart disease Brother      Social History: Social History   Socioeconomic History  . Marital status: Married    Spouse name: Not on file  . Number of children: Not on file  . Years of education: Not on file  . Highest education level: Not on file  Occupational History  . Not on file  Tobacco Use  . Smoking status: Former Smoker    Packs/day: 1.50    Years: 50.00    Pack years: 75.00    Types: Cigarettes    Quit date: 02/11/2013    Years since quitting: 6.8  . Smokeless tobacco: Never Used  Substance and Sexual Activity  . Alcohol use: Yes    Alcohol/week: 10.0 standard drinks    Types: 3 Cans of beer, 7 Shots of liquor per week    Comment: daily cocktails 3-4  . Drug use: No  . Sexual activity: Not on file  Other Topics Concern  . Not on file  Social History Narrative  . Not on file   Social Determinants of Health   Financial Resource Strain:   . Difficulty of Paying Living Expenses: Not on file  Food Insecurity:   . Worried About Charity fundraiser in the Last Year: Not on file  . Ran Out of Food in the  Last Year: Not on file  Transportation Needs:   . Lack of Transportation (Medical): Not on file  . Lack of Transportation (Non-Medical): Not on file  Physical Activity:   . Days of Exercise per Week: Not on file  . Minutes of Exercise per Session: Not on file  Stress:   . Feeling of Stress : Not on file  Social Connections:   . Frequency of Communication with Friends and Family: Not on file  . Frequency of Social Gatherings with Friends and Family: Not on file  . Attends Religious Services: Not on file  . Active Member of Clubs or Organizations: Not on file  . Attends Archivist Meetings: Not on file  . Marital Status: Not on file  Intimate Partner Violence:   . Fear of Current or Ex-Partner: Not on file  . Emotionally Abused: Not on file  . Physically Abused: Not on file  . Sexually Abused: Not on file     Review of Systems: Review of Systems  Constitutional: Positive for malaise/fatigue and weight loss.  HENT: Negative.   Eyes: Negative.   Respiratory: Positive for shortness of breath and wheezing. Negative for cough, hemoptysis and sputum production.  Cardiovascular: Positive for orthopnea, leg swelling and PND. Negative for chest pain, palpitations and claudication.  Gastrointestinal: Negative.   Genitourinary: Positive for dysuria. Negative for flank pain, frequency, hematuria and urgency.  Musculoskeletal: Negative for back pain, falls, joint pain, myalgias and neck pain.  Skin: Negative.   Neurological: Negative.   Endo/Heme/Allergies: Negative.   Psychiatric/Behavioral: Negative.     Vital Signs: Blood pressure (!) 83/56, pulse 90, temperature 97.8 F (36.6 C), resp. rate 20, height _0  (1.854 m), weight 109.7 kg, SpO2 95 %.  Weight trends: Filed Weights   12/31/2019 1455 12/27/19 0500  Weight: 103 kg 109.7 kg    Physical Exam: General: Ill appearing, laying in bed  Head: Normocephalic, atraumatic. Moist oral mucosal membranes  Eyes: Anicteric,  PERRL  Neck: Supple, trachea midline  Lungs:  Diminished bilaterally  Heart: Regular rate and rhythm  Abdomen:  Soft, nontender,   Extremities:  ++ peripheral edema.  Neurologic: Nonfocal, moving all four extremities  Skin: No lesions  Access: none     Lab results: Basic Metabolic Panel: Recent Labs  Lab 12/25/2019 1511 12/21/2019 2342 12/27/19 0340  NA 127* 123* 124*  K 4.8 4.9 4.9  CL 87* 85* 85*  CO2 21* 19* 18*  GLUCOSE 79 133* 121*  BUN 64* 67* 66*  CREATININE 6.15* 6.19* 6.19*  CALCIUM 7.8* 7.4* 7.3*  MG 2.2  --  2.2  PHOS  --   --  6.5*    Liver Function Tests: Recent Labs  Lab 12/20/2019 1511 12/27/19 0340  AST 195* 244*  ALT 81* 94*  ALKPHOS 413* 464*  BILITOT 7.6* 8.7*  PROT 4.2* 4.7*  ALBUMIN 1.8* 2.0*   No results for input(s): LIPASE, AMYLASE in the last 168 hours. No results for input(s): AMMONIA in the last 168 hours.  CBC: Recent Labs  Lab 12/23/2019 1511 12/27/19 0340  WBC 10.4 12.2*  NEUTROABS  --  10.0*  HGB 12.5* 12.2*  HCT RESULTS UNAVAILABLE DUE TO INTERFERING SUBSTANCE RESULTS UNAVAILABLE DUE TO INTERFERING SUBSTANCE  MCV RESULTS UNAVAILABLE DUE TO INTERFERING SUBSTANCE RESULTS UNAVAILABLE DUE TO INTERFERING SUBSTANCE  PLT 132* 137*    Cardiac Enzymes: No results for input(s): CKTOTAL, CKMB, CKMBINDEX, TROPONINI in the last 168 hours.  BNP: Invalid input(s): POCBNP  CBG: Recent Labs  Lab 12/27/19 0000 12/27/19 0213 12/27/19 0748  GLUCAP 128* 106* 112*    Microbiology: Results for orders placed or performed during the hospital encounter of 12/22/2019  Resp Panel by RT-PCR (Flu A&B, Covid) Nasopharyngeal Swab     Status: None   Collection Time: 12/27/2019  3:11 PM   Specimen: Nasopharyngeal Swab; Nasopharyngeal(NP) swabs in vial transport medium  Result Value Ref Range Status   SARS Coronavirus 2 by RT PCR NEGATIVE NEGATIVE Final    Comment: (NOTE) SARS-CoV-2 target nucleic acids are NOT DETECTED.  The SARS-CoV-2 RNA is  generally detectable in upper respiratory specimens during the acute phase of infection. The lowest concentration of SARS-CoV-2 viral copies this assay can detect is 138 copies/mL. A negative result does not preclude SARS-Cov-2 infection and should not be used as the sole basis for treatment or other patient management decisions. A negative result may occur with  improper specimen collection/handling, submission of specimen other than nasopharyngeal swab, presence of viral mutation(s) within the areas targeted by this assay, and inadequate number of viral copies(<138 copies/mL). A negative result must be combined with clinical observations, patient history, and epidemiological information. The expected result is Negative.  Fact Sheet for  Patients:  EntrepreneurPulse.com.au  Fact Sheet for Healthcare Providers:  IncredibleEmployment.be  This test is no t yet approved or cleared by the [place] FDA and  has been authorized for detection and/or diagnosis of SARS-CoV-2 by FDA under an Emergency Use Authorization (EUA). This EUA will remain  in effect (meaning this test can be used) for the duration of the COVID-19 declaration under Section 564(b)(1) of the Act, 21 U.S.C.section 360bbb-3(b)(1), unless the authorization is terminated  or revoked sooner.       Influenza A by PCR NEGATIVE NEGATIVE Final   Influenza B by PCR NEGATIVE NEGATIVE Final    Comment: (NOTE) The Xpert Xpress SARS-CoV-2/FLU/RSV plus assay is intended as an aid in the diagnosis of influenza from Nasopharyngeal swab specimens and should not be used as a sole basis for treatment. Nasal washings and aspirates are unacceptable for Xpert Xpress SARS-CoV-2/FLU/RSV testing.  Fact Sheet for Patients: EntrepreneurPulse.com.au  Fact Sheet for Healthcare Providers: IncredibleEmployment.be  This test is not yet approved or cleared by the [place] FDA and has been authorized for detection and/or diagnosis of SARS-CoV-2 by FDA under an Emergency Use Authorization (EUA). This EUA will remain in effect (meaning this test can be used) for the duration of the COVID-19 declaration under Section 564(b)(1) of the Act, 21 U.S.C. section 360bbb-3(b)(1), unless the authorization is terminated or revoked.  Performed at [facility], [address] [place], [place] [address]   Blood culture (routine x 2)     Status: None (Preliminary result)   Collection Time: 12/18/2019  3:11 PM   Specimen: BLOOD  Result Value Ref Range Status   Specimen Description BLOOD BLOOD LEFT FOREARM  Final   Special Requests   Final    BOTTLES DRAWN AEROBIC AND ANAEROBIC Blood Culture adequate volume   Culture   Final    NO GROWTH < 24 HOURS Performed at [facility], [address] [place], [place] [address]    Report Status PENDING  Incomplete  Blood culture (routine x 2)     Status: None (Preliminary result)   Collection Time: 12/20/2019  3:48 PM   Specimen: BLOOD  Result Value Ref Range Status   Specimen Description BLOOD LEFT ANTECUBITAL  Final   Special Requests   Final    BOTTLES DRAWN AEROBIC AND ANAEROBIC Blood Culture adequate volume   Culture   Final    NO GROWTH < 24 HOURS Performed at [facility], [address], [place], [place] [address]    Report Status PENDING  Incomplete  MRSA PCR Screening     Status: None   Collection Time: 12/25/2019  7:32 PM   Specimen: Nasopharyngeal  Result Value Ref Range Status   MRSA by PCR NEGATIVE NEGATIVE Final    Comment:        The GeneXpert MRSA Assay (FDA approved for NASAL specimens only), is one component of a comprehensive MRSA colonization surveillance program. It is not intended to diagnose MRSA infection nor to guide or monitor treatment for MRSA infections. Performed at [facility], [address] [place],  [address]     Coagulation  Studies: Recent Labs    12/20/2019 1511  LABPROT 16.5*  INR 1.4*    Urinalysis: Recent Labs    12/10/2019 1511  COLORURINE AMBER*  LABSPEC 1.023  PHURINE 5.0  GLUCOSEU 50*  HGBUR SMALL*  BILIRUBINUR SMALL*  KETONESUR NEGATIVE  PROTEINUR 100*  NITRITE NEGATIVE  LEUKOCYTESUR NEGATIVE      Imaging:  US Renal  Result Date: 12/09/2019 CLINICAL DATA:  Acute renal failure EXAM: RENAL / URINARY TRACT ULTRASOUND COMPLETE COMPARISON:  Ultrasound report kidney 12/09/2008 FINDINGS: Right Kidney: Renal measurements: 11.4 x 5.1 x 5.3 cm = volume: 161.2 mL. Cortex is echogenic. No mass. No hydronephrosis Left Kidney: Renal measurements: 11.7 x 6.2 x 5 cm = volume: 187.1 mL. Cortex is echogenic. There is mild hydronephrosis. Complex cystic mass at the upper pole measuring 1.6 x 1.4 x 1.3 cm with multiple septations. Bladder: Not well visualized and likely empty. Other: Suspect subtle contour nodularity of the liver. Small moderate perihepatic ascites. IMPRESSION: 1. Echogenic kidneys bilaterally suggesting medical renal disease. There is mild left hydronephrosis. 2. 1.6 cm complex septated cyst in the upper pole left kidney. When the patient is clinically stable and able to follow directions and hold their breath (preferably as an outpatient) further evaluation with dedicated abdominal MRI should be considered. 3. Possible cirrhotic morphology of liver. Small moderate perihepatic ascites Electronically Signed   By: Donavan Foil M.D.   On: 12/28/2019 17:34   DG Chest Port 1 View  Result Date: 12/08/2019 CLINICAL DATA:  Central line placement EXAM: PORTABLE CHEST 1 VIEW COMPARISON:  12/19/2019 FINDINGS: Left-sided central venous catheter tip overlies the SVC origin. No pneumothorax. Cardiomegaly with vascular congestion. New hazy pulmonary opacities suggestive of pleural effusions or edema. This is more prominent on the right side. IMPRESSION: 1. Left-sided central venous catheter tip overlies the SVC  origin. No pneumothorax. 2. New hazy pulmonary opacities suggestive of pleural effusions and/or edema. Electronically Signed   By: Donavan Foil M.D.   On: 12/27/2019 20:43   DG Chest Port 1 View  Result Date: 12/18/2019 CLINICAL DATA:  Hypotension EXAM: PORTABLE CHEST 1 VIEW COMPARISON:  10/25/2013 FINDINGS: Mild cardiomegaly. Minimal atelectasis left base. No consolidation or effusion. No pneumothorax. IMPRESSION: Mild cardiomegaly. Minimal atelectasis at the left base. Electronically Signed   By: Donavan Foil M.D.   On: 12/08/2019 15:47   US ABDOMEN LIMITED RUQ (LIVER/GB)  Result Date: 12/05/2019 CLINICAL DATA:  69 year old male with hyperbilirubinemia. EXAM: ULTRASOUND ABDOMEN LIMITED RIGHT UPPER QUADRANT COMPARISON:  None. FINDINGS: Gallbladder: The gallbladder is filled with sludge. There is no gallbladder wall thickening. Negative sonographic Murphy's sign. Common bile duct: Diameter: 4 mm Liver: The liver demonstrates a coarsened echotexture. There is mild surface irregularity concerning for changes of cirrhosis. Portal vein is patent on color Doppler imaging with normal direction of blood flow towards the liver. Other: Small ascites.  Partially visualized right pleural effusion. IMPRESSION: 1. Sludge filled gallbladder without sonographic findings of acute cholecystitis. 2. Probable cirrhosis.  Clinical correlation is recommended. 3. Patent main portal vein with hepatopetal flow. 4. Small ascites and right pleural effusion. Electronically Signed   By: Anner Crete M.D.   On: 12/09/2019 17:32      Assessment & Plan: Mason Johnson is a 69 y.o. black male with multiple myeloma, amyloidosis, sleep apnea, hypertension, hyperlipidemia, diabetes mellitus type II, glaucoma, BPH, who was admitted to [facility] on 12/25/2019 for Cardiogenic shock ([place]) [R57.0] Hypochloremia [E87.8] Bilirubinemia [E80.6] Hyponatremia [E87.1] Shock ([place]) [R57.9] Transaminitis [R74.01] NSTEMI (non-ST elevated  myocardial infarction) ([place]) [I21.4] Elevated brain natriuretic peptide (BNP) level [R79.89] AKI (acute kidney injury) ([place]) [D35.7] Metabolic acidosis, increased anion gap [E87.2] Hypotension, unspecified hypotension type [I95.9] Atrial fibrillation, unspecified type ([place]) [I48.91]  1. Acute kidney injury with metabolic acidosis: on chronic kidney disease stage IV with baseline creatinine of 2.8, GFR of 27 on 11/21.  anuric and requiring vasopressors.  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  Acute kidney injury secondary to acute cardiorenal syndrome.   2. 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  3. Hyponatremia: secondary to renal failure and heart failure.   Impression: Low threshold for renal replacement therapy. However this will not change patient's overall prognosis. Discussed with patient and wife. They want to discuss with their family before proceeding with renal replacement therapy.    LOS: 1 Jenilee Franey 11/25/20218:23 AM

## 2019-12-27 NOTE — Progress Notes (Signed)
Pts wife has arrived at bedside I updated Mrs. Cayabyab regarding current plan of care and all questions were answered.  Will continue to monitor and assess pt.  Marda Stalker, [place] Pager [phone] (please enter 7 digits) PCCM Consult Pager [phone] (please enter 7 digits)

## 2019-12-27 NOTE — Progress Notes (Signed)
ANTICOAGULATION CONSULT NOTE  Pharmacy Consult for Heparin infusion Indication: ACS/STEMI  No Known Allergies  Patient Measurements: Height: 6\' 1"  (185.4 cm) Weight: 103 kg (227 lb) IBW/kg (Calculated) : 79.9 Heparin Dosing Weight: 100.8 kg  Vital Signs: Temp: 97.6 F (36.4 C) (11/25 0030) Temp Source: Axillary (11/25 0030) BP: 99/61 (11/25 0330) Pulse Rate: 90 (11/25 0330)  Labs: Recent Labs    12/29/2019 1511 12/29/2019 1741 12/25/2019 2342 12/27/19 0340  HGB 12.5*  --   --   --   HCT RESULTS UNAVAILABLE DUE TO INTERFERING SUBSTANCE  --   --   --   PLT 132*  --   --   --   APTT 53*  --   --   --   LABPROT 16.5*  --   --   --   INR 1.4*  --   --   --   HEPARINUNFRC  --   --   --  0.22*  CREATININE 6.15*  --  6.19*  --   TROPONINIHS 1,363* 1,382*  --   --     Estimated Creatinine Clearance: 14 mL/min (A) (by C-G formula based on SCr of 6.19 mg/dL (H)).  Assessment:  69 y.o. male presenting with hypotension resulting in general malaise.  BNP 918, HS Troponin 1363, LA 7.1   Goal of Therapy:  Heparin level 0.3-0.7 units/ml Monitor platelets by anticoagulation protocol: Yes   Plan:  Give 4000 units bolus x 1 Start heparin infusion at 1350 units/hr Check anti-Xa level in 8 hours and daily while on heparin Continue to monitor H&H and platelets   11/25 @ 0340 HL 0.22, subtherapeutic.  Will increase Heparin infusion to 1600 units/hr and recheck HL in 8 hours  Hart Robinsons, PharmD Clinical Pharmacist  12/27/2019 4:23 AM

## 2019-12-27 NOTE — Progress Notes (Signed)
GOALS OF CARE DISCUSSION  The Clinical status was relayed to family in detail-Patient, wife, 2 daughters at bedside.  Updated and notified of patients medical condition.  patient with increased WOB Patient with progressive shock and renal failure Patient currently alert and awake follows commands Patient is able to express to family his wants and wishes  Explained to family course of therapy and the modalities     Patient with Progressive multiorgan failure with very low chance of meaningful recovery despite all aggressive and optimal medical therapy. Patient is in the Dying  Process associated with Suffering.  Family understands the situation-PATIENT HAS EXPRESSED HE DOES NOT WANT MACHINES AND HE DOES NOT WANT HEMODIALYSIS. HE DOES NOT WANT CPR, CHEST COMPRESSIONS, ELECTRIC SHOCK THERAPY  PATIENT AND FAMILY  have consented and agreed to DNR/DNI.  Family are satisfied with Plan of action and management. All questions answered  1.continue vasopressors as prescribed 2.place DNR/DNI orders 3.End of Life Policy regarding visitor policy  Additional CC time 60 mins   Miasha Emmons Patricia Pesa, M.D.  [facility]  Medical Director [place] Director [facility] Cardio-Pulmonary Department

## 2019-12-27 NOTE — Progress Notes (Signed)
CRITICAL CARE NOTE  CC  follow up respiratory failure  SUBJECTIVE Patient remains critically ill Prognosis is guarded Cardiogenic and septic shock Severe renal failure     BP 98/63   Pulse 89   Temp 97.8 F (36.6 C)   Resp 13   Ht _0  (1.854 m)   Wt 109.7 kg   SpO2 96%   BMI 31.91 kg/m    I/O last 3 completed shifts: In: 2708 [I.V.:1863.1; IV Piggyback:844.9] Out: 28 [Urine:28] Total I/O In: 120 [P.O.:120] Out: -   SpO2: 96 % O2 Flow Rate (L/min): 2 L/min  Estimated body mass index is 31.91 kg/m as calculated from the following:   Height as of this encounter: _1  (1.854 m).   Weight as of this encounter: 109.7 kg.   Review of Systems: Alert and awake +moans and groans in pain  Other:  All other systems negative     Physical Examination:   General Appearance: +distress, ill appearing Neuro:without focal findings,  speech normal,  HEENT: PERRLA, EOM intact.   Pulmonary: normal breath sounds, No wheezing.  CardiovascularNormal S1,S2.  No m/r/g.   Abdomen: Benign, Soft, non-tender. PSYCHIATRIC: Mood, affect within normal limits.   ALL OTHER ROS ARE NEGATIVE    MEDICATIONS: I have reviewed all medications and confirmed regimen as documented   CULTURE RESULTS   Recent Results (from the past 240 hour(s))  Resp Panel by RT-PCR (Flu A&B, Covid) Nasopharyngeal Swab     Status: None   Collection Time: 12/09/2019  3:11 PM   Specimen: Nasopharyngeal Swab; Nasopharyngeal(NP) swabs in vial transport medium  Result Value Ref Range Status   SARS Coronavirus 2 by RT PCR NEGATIVE NEGATIVE Final    Comment: (NOTE) SARS-CoV-2 target nucleic acids are NOT DETECTED.  The SARS-CoV-2 RNA is generally detectable in upper respiratory specimens during the acute phase of infection. The lowest concentration of SARS-CoV-2 viral copies this assay can detect is 138 copies/mL. A negative result does not preclude SARS-Cov-2 infection and should not be used as the  sole basis for treatment or other patient management decisions. A negative result may occur with  improper specimen collection/handling, submission of specimen other than nasopharyngeal swab, presence of viral mutation(s) within the areas targeted by this assay, and inadequate number of viral copies(<138 copies/mL). A negative result must be combined with clinical observations, patient history, and epidemiological information. The expected result is Negative.  Fact Sheet for Patients:  EntrepreneurPulse.com.au  Fact Sheet for Healthcare Providers:  IncredibleEmployment.be  This test is no t yet approved or cleared by the [place] FDA and  has been authorized for detection and/or diagnosis of SARS-CoV-2 by FDA under an Emergency Use Authorization (EUA). This EUA will remain  in effect (meaning this test can be used) for the duration of the COVID-19 declaration under Section 564(b)(1) of the Act, 21 U.S.C.section 360bbb-3(b)(1), unless the authorization is terminated  or revoked sooner.       Influenza A by PCR NEGATIVE NEGATIVE Final   Influenza B by PCR NEGATIVE NEGATIVE Final    Comment: (NOTE) The Xpert Xpress SARS-CoV-2/FLU/RSV plus assay is intended as an aid in the diagnosis of influenza from Nasopharyngeal swab specimens and should not be used as a sole basis for treatment. Nasal washings and aspirates are unacceptable for Xpert Xpress SARS-CoV-2/FLU/RSV testing.  Fact Sheet for Patients: EntrepreneurPulse.com.au  Fact Sheet for Healthcare Providers: IncredibleEmployment.be  This test is not yet approved or cleared by the [place] and has been authorized  for detection and/or diagnosis of SARS-CoV-2 by FDA under an Emergency Use Authorization (EUA). This EUA will remain in effect (meaning this test can be used) for the duration of the COVID-19 declaration under Section 564(b)(1) of the  Act, 21 U.S.C. section 360bbb-3(b)(1), unless the authorization is terminated or revoked.  Performed at [facility], [address] [place], [place] [address]   Blood culture (routine x 2)     Status: None (Preliminary result)   Collection Time: 12/20/2019  3:11 PM   Specimen: BLOOD  Result Value Ref Range Status   Specimen Description BLOOD BLOOD LEFT FOREARM  Final   Special Requests   Final    BOTTLES DRAWN AEROBIC AND ANAEROBIC Blood Culture adequate volume   Culture   Final    NO GROWTH < 24 HOURS Performed at [facility], [address] [place], [place] [address]    Report Status PENDING  Incomplete  Blood culture (routine x 2)     Status: None (Preliminary result)   Collection Time: 12/09/2019  3:48 PM   Specimen: BLOOD  Result Value Ref Range Status   Specimen Description BLOOD LEFT ANTECUBITAL  Final   Special Requests   Final    BOTTLES DRAWN AEROBIC AND ANAEROBIC Blood Culture adequate volume   Culture   Final    NO GROWTH < 24 HOURS Performed at [facility], [address], [place], [place] [address]    Report Status PENDING  Incomplete  MRSA PCR Screening     Status: None   Collection Time: 12/20/2019  7:32 PM   Specimen: Nasopharyngeal  Result Value Ref Range Status   MRSA by PCR NEGATIVE NEGATIVE Final    Comment:        The GeneXpert MRSA Assay (FDA approved for NASAL specimens only), is one component of a comprehensive MRSA colonization surveillance program. It is not intended to diagnose MRSA infection nor to guide or monitor treatment for MRSA infections. Performed at [facility], [address] [place], [place] [address]           IMAGING    US Renal  Result Date: 12/12/2019 CLINICAL DATA:  Acute renal failure EXAM: RENAL / URINARY TRACT ULTRASOUND COMPLETE COMPARISON:  Ultrasound report kidney 12/09/2008 FINDINGS: Right Kidney: Renal measurements: 11.4 x 5.1 x 5.3 cm = volume: 161.2 mL. Cortex is  echogenic. No mass. No hydronephrosis Left Kidney: Renal measurements: 11.7 x 6.2 x 5 cm = volume: 187.1 mL. Cortex is echogenic. There is mild hydronephrosis. Complex cystic mass at the upper pole measuring 1.6 x 1.4 x 1.3 cm with multiple septations. Bladder: Not well visualized and likely empty. Other: Suspect subtle contour nodularity of the liver. Small moderate perihepatic ascites. IMPRESSION: 1. Echogenic kidneys bilaterally suggesting medical renal disease. There is mild left hydronephrosis. 2. 1.6 cm complex septated cyst in the upper pole left kidney. When the patient is clinically stable and able to follow directions and hold their breath (preferably as an outpatient) further evaluation with dedicated abdominal MRI should be considered. 3. Possible cirrhotic morphology of liver. Small moderate perihepatic ascites Electronically Signed   By: Donavan Foil M.D.   On: 12/08/2019 17:34   DG Chest Port 1 View  Result Date: 12/27/2019 CLINICAL DATA:  Acute respiratory failure. EXAM: PORTABLE CHEST 1 VIEW COMPARISON:  Chest radiograph 12/12/2019. FINDINGS: Left IJ central venous catheter tip projects over the superior vena cava. Monitoring leads overlie the patient. Stable cardiac and mediastinal contours. Minimal heterogeneous opacities  lung bases bilaterally. Probable small right pleural effusion. No pneumothorax. IMPRESSION: Bibasilar atelectasis. Probable small right pleural effusion. Electronically Signed   By: Lovey Newcomer M.D.   On: 12/27/2019 08:46   DG Chest Port 1 View  Result Date: 12/12/2019 CLINICAL DATA:  Central line placement EXAM: PORTABLE CHEST 1 VIEW COMPARISON:  12/21/2019 FINDINGS: Left-sided central venous catheter tip overlies the SVC origin. No pneumothorax. Cardiomegaly with vascular congestion. New hazy pulmonary opacities suggestive of pleural effusions or edema. This is more prominent on the right side. IMPRESSION: 1. Left-sided central venous catheter tip overlies the SVC  origin. No pneumothorax. 2. New hazy pulmonary opacities suggestive of pleural effusions and/or edema. Electronically Signed   By: Donavan Foil M.D.   On: 12/03/2019 20:43   DG Chest Port 1 View  Result Date: 12/06/2019 CLINICAL DATA:  Hypotension EXAM: PORTABLE CHEST 1 VIEW COMPARISON:  10/25/2013 FINDINGS: Mild cardiomegaly. Minimal atelectasis left base. No consolidation or effusion. No pneumothorax. IMPRESSION: Mild cardiomegaly. Minimal atelectasis at the left base. Electronically Signed   By: Donavan Foil M.D.   On: 12/18/2019 15:47   US ABDOMEN LIMITED RUQ (LIVER/GB)  Result Date: 12/10/2019 CLINICAL DATA:  69 year old male with hyperbilirubinemia. EXAM: ULTRASOUND ABDOMEN LIMITED RIGHT UPPER QUADRANT COMPARISON:  None. FINDINGS: Gallbladder: The gallbladder is filled with sludge. There is no gallbladder wall thickening. Negative sonographic Murphy's sign. Common bile duct: Diameter: 4 mm Liver: The liver demonstrates a coarsened echotexture. There is mild surface irregularity concerning for changes of cirrhosis. Portal vein is patent on color Doppler imaging with normal direction of blood flow towards the liver. Other: Small ascites.  Partially visualized right pleural effusion. IMPRESSION: 1. Sludge filled gallbladder without sonographic findings of acute cholecystitis. 2. Probable cirrhosis.  Clinical correlation is recommended. 3. Patent main portal vein with hepatopetal flow. 4. Small ascites and right pleural effusion. Electronically Signed   By: Anner Crete M.D.   On: 12/05/2019 17:32     Nutrition Status:           Indwelling Urinary Catheter continued, requirement due to   Reason to continue Indwelling Urinary Catheter strict Intake/Output monitoring for hemodynamic instability   Central Line/ continued, requirement due to  Reason to continue [place] of central venous pressure or other hemodynamic parameters and poor IV access      ASSESSMENT AND  PLAN SYNOPSIS  69 yo AAM admitted to ICU for severe shock(cardiogenic and septic shock) with progressive multiorgan failure with renal failure in the setting of progressive end stage cardiac/liver amyloidosis with underlying end stage Multiple myeloma   ACUTE SYSTOLIC CARDIAC FAILURE-  -oxygen as needed -Lasix as tolerated -follow up cardiac enzymes as indicated -follow up cardiology recs Vasopressors as needed  ACUTE KIDNEY INJURY/Renal Failure -continue Foley Catheter-assess need -Avoid nephrotoxic agents -Follow urine output, BMP -Ensure adequate renal perfusion, optimize oxygenation -Renal dose medications   SHOCK-SEPSIS/CARDIOGENIC -use vasopressors to keep MAP>65 -follow ABG and LA -follow up cultures -emperic ABX -stress dose steroids -IV fluid resuscitation  CARDIAC ICU monitoring  ID -continue IV abx as prescibed -follow up cultures  GI GI PROPHYLAXIS as indicated  NUTRITIONAL STATUS Nutrition Status:         DIET--> as tolerated Constipation protocol as indicated  ENDO - will use ICU hypoglycemic\Hyperglycemia protocol if indicated     ELECTROLYTES -follow labs as needed -replace as needed -pharmacy consultation and following   DVT/GI PRX ordered and assessed TRANSFUSIONS AS NEEDED MONITOR FSBS I Assessed the need for Labs I  Assessed the need for Foley I Assessed the need for Central Venous Line Family Discussion when available I Assessed the need for Mobilization I made an Assessment of medications to be adjusted accordingly Safety Risk assessment completed   CASE DISCUSSED IN MULTIDISCIPLINARY ROUNDS WITH ICU TEAM  Critical Care Time devoted to patient care services described in this note is 78 minutes.   Overall, patient is critically ill, prognosis is guarded.  Patient with Multiorgan failure and at high risk for cardiac arrest and death.    Corrin Parker, M.D.  [facility]  Medical  Director [place] Director [facility] Cardio-Pulmonary Department

## 2019-12-27 NOTE — Consult Note (Addendum)
Pharmacy Antibiotic Note  Mason Johnson is a 69 y.o. male with medical history including multiple myeloma on chemotherapy, cardiac amyloid admitted on 12/22/2019 with sepsis. Patient presented to the ED 11/24 with hypotension, lethargy, weakness. Labs notable for Scr 6.15 (was 2.8 on 11/21), troponin 1363, lactic acid 7.1, PCT 4.24, elevated transaminases. Vitals with hypotension. ECHO is pending. Pharmacy has been consulted for Zosyn dosing.  Plan: Day 2 of abx. Will change to Zosyn 3.375 g IV q12h EI (CrCl < 20, but not on HD)  Will continue to monitor renal function and adjust antibiotics as indicated. Continue to monitor culture results and narrow antibiotics as able.  Height: '6\' 1"'  (185.4 cm) Weight: 109.7 kg (241 lb 13.5 oz) IBW/kg (Calculated) : 79.9  Temp (24hrs), Avg:97.7 F (36.5 C), Min:97.4 F (36.3 C), Max:98.2 F (36.8 C)  Recent Labs  Lab 12/14/2019 1511 12/06/2019 1741 12/27/2019 2203 12/09/2019 2342 12/27/19 0340  WBC 10.4  --   --   --  12.2*  CREATININE 6.15*  --   --  6.19* 6.19*  LATICACIDVEN 7.1* 7.5* 5.9*  --  7.3*    Estimated Creatinine Clearance: 14.4 mL/min (A) (by C-G formula based on SCr of 6.19 mg/dL (H)).    No Known Allergies  Antimicrobials this admission: Cefepime 11/24 x 1 Zosyn 11/24 >>   Dose adjustments this admission: N/A  Microbiology results: 11/24 BCx: pending 11/24 UCx: pending  11/24 MRSA PCR: negative  Thank you for allowing pharmacy to be a part of this patients care.  Oswald Hillock 12/27/2019 2:46 PM

## 2019-12-27 NOTE — Significant Event (Signed)
Pt continues to decline despite aggressive treatment now requiring maximal doses of vasopressin, levophed, and neo-synephrine gtts.  Notified pts wife Kairon Shock via telephone regarding decline in pts condition, and informed her she could come back to the hospital to be present at pts bedside.  I also contacted on call physician Dr. Mortimer Fries via telephone to discuss decline in pts condition and current plan of care.  He agreed with obtaining vbg and bmp to determine if pt has developed metabolic acidosis and/or worsening acute renal failure requiring CRRT.  Also concern is progression of multiple myeloma.  CT Abd Pelvis without contrast ordered, however at this time pt too unstable for transport.  Will continue to monitor and assess pt.  Marda Stalker, [place] Pager [phone] (please enter 7 digits) PCCM Consult Pager [phone] (please enter 7 digits)

## 2019-12-27 NOTE — Progress Notes (Signed)
ANTICOAGULATION CONSULT NOTE  Pharmacy Consult for Heparin infusion Indication: ACS/STEMI  No Known Allergies  Patient Measurements: Height: 6\' 1"  (185.4 cm) Weight: 109.7 kg (241 lb 13.5 oz) IBW/kg (Calculated) : 79.9 Heparin Dosing Weight: 100.8 kg  Vital Signs: Temp: 98.1 F (36.7 C) (11/25 2000) Temp Source: Axillary (11/25 2000) BP: 99/59 (11/25 2200) Pulse Rate: 93 (11/25 2200)  Labs: Recent Labs    12/17/2019 1511 12/16/2019 1741 12/21/2019 2342 12/27/19 0340 12/27/19 0549 12/27/19 0753 12/27/19 1329 12/27/19 2106  HGB 12.5*  --   --  12.2*  --   --   --   --   HCT RESULTS UNAVAILABLE DUE TO INTERFERING SUBSTANCE  --   --  RESULTS UNAVAILABLE DUE TO INTERFERING SUBSTANCE  --   --   --   --   PLT 132*  --   --  137*  --   --   --   --   APTT 53*  --   --   --   --   --   --   --   LABPROT 16.5*  --   --   --   --   --   --   --   INR 1.4*  --   --   --   --   --   --   --   HEPARINUNFRC  --   --   --  0.22*  --   --  0.33 0.34  CREATININE 6.15*  --  6.19* 6.19*  --   --   --   --   TROPONINIHS 1,363*   < >  --  2,520* 2,584* 2,724*  --   --    < > = values in this interval not displayed.    Estimated Creatinine Clearance: 14.4 mL/min (A) (by C-G formula based on SCr of 6.19 mg/dL (H)).  Assessment:  69 y.o. male presenting with hypotension resulting in general malaise.  BNP 918, HS Troponin 1363, LA 7.1  11/25 0340 HL 0.22  11/25 1329 HL 0.33  11/25 2106 HL 0.34, therapeutic x 2   Goal of Therapy:  Heparin level 0.3-0.7 units/ml Monitor platelets by anticoagulation protocol: Yes   Plan:  Heparin level is therapeutic.  Will continueHeparin infusion at 1600 units/hr and recheck HL in 8 hours  Hart Robinsons, PharmD Clinical Pharmacist   12/27/2019 10:47 PM

## 2019-12-27 NOTE — Progress Notes (Signed)
Progress Note  Patient Name: Mason Johnson Date of Encounter: 12/27/2019  [facility] Cardiologist: new- Dr. Garen Lah consulting [facility] cardiology- outpatient)  Subjective   Edema slightly improved from yesterday, blood pressures on the low side, requiring multiple pressors to maintain blood pressure.  Inpatient Medications    Scheduled Meds: . atorvastatin  40 mg Oral Daily  . Chlorhexidine Gluconate Cloth  6 each Topical Daily  . fluocinonide cream  1 application Topical UD  . hydrocortisone sod succinate (SOLU-CORTEF) inj  50 mg Intravenous Q6H  . latanoprost  1 drop Both Eyes QHS  . lidocaine  5 mL Intradermal Once  . midodrine  15 mg Oral TID WC  . pantoprazole  40 mg Oral Daily  . sodium chloride flush  3 mL Intravenous Q12H  . tamsulosin  0.4 mg Oral Daily   Continuous Infusions: . sodium chloride    . [START ON 12/28/2019] famotidine (PEPCID) IV    . heparin 1,600 Units/hr (12/27/19 0700)  . norepinephrine (LEVOPHED) Adult infusion 40 mcg/min (12/27/19 0700)  . phenylephrine (NEO-SYNEPHRINE) Adult infusion 400 mcg/min (12/27/19 0700)  . piperacillin-tazobactam (ZOSYN)  IV Stopped (12/15/2019 2318)  . vasopressin 0.04 Units/min (12/27/19 0700)   PRN Meds: sodium chloride, acetaminophen, docusate sodium, ondansetron (ZOFRAN) IV, polyethylene glycol, sodium chloride flush   Vital Signs    Vitals:   12/27/19 0645 12/27/19 0700 12/27/19 0800 12/27/19 0900  BP: 91/62 (!) 86/60 (!) 83/56 98/63  Pulse: 92 92 90 89  Resp: '14 12 20 13  ' Temp:   97.8 F (36.6 C)   TempSrc:      SpO2: 93% 95% 95% 96%  Weight:      Height:        Intake/Output Summary (Last 24 hours) at 12/27/2019 1219 Last data filed at 12/27/2019 1010 Gross per 24 hour  Intake 2834.01 ml  Output 28 ml  Net 2806.01 ml   Last 3 Weights 12/27/2019 12/23/2019 11/25/2017  Weight (lbs) 241 lb 13.5 oz 227 lb 261 lb 1.6 oz  Weight (kg) 109.7 kg 102.967 kg 118.434 kg      Telemetry      Sinus rhythm- Personally Reviewed  ECG    Yesterday EKG sinus rhythm- Personally Reviewed  Physical Exam   GEN:  Mild respiratory distress Neck: No JVD Cardiac: RRR, no murmurs, rubs, or gallops.  Respiratory:  Diminished breath sounds at bases GI: Soft, nontender, non-distended  MS: 2+ edema; No deformity. Neuro:  Nonfocal  Psych: Normal affect   Labs    High Sensitivity Troponin:   Recent Labs  Lab 12/27/2019 1511 12/17/2019 1741 12/27/19 0340 12/27/19 0549 12/27/19 0753  TROPONINIHS 1,363* 1,382* 2,520* 2,584* 2,724*      Chemistry Recent Labs  Lab 12/15/2019 1511 12/21/2019 2342 12/27/19 0340  NA 127* 123* 124*  K 4.8 4.9 4.9  CL 87* 85* 85*  CO2 21* 19* 18*  GLUCOSE 79 133* 121*  BUN 64* 67* 66*  CREATININE 6.15* 6.19* 6.19*  CALCIUM 7.8* 7.4* 7.3*  PROT 4.2*  --  4.7*  ALBUMIN 1.8*  --  2.0*  AST 195*  --  244*  ALT 81*  --  94*  ALKPHOS 413*  --  464*  BILITOT 7.6*  --  8.7*  GFRNONAA 9* 9* 9*  ANIONGAP 19* 19* 21*     Hematology Recent Labs  Lab 12/19/2019 1511 12/27/19 0340  WBC 10.4 12.2*  RBC RESULTS UNAVAILABLE DUE TO INTERFERING SUBSTANCE RESULTS UNAVAILABLE DUE TO INTERFERING SUBSTANCE  HGB 12.5* 12.2*  HCT RESULTS UNAVAILABLE DUE TO INTERFERING SUBSTANCE RESULTS UNAVAILABLE DUE TO INTERFERING SUBSTANCE  MCV RESULTS UNAVAILABLE DUE TO INTERFERING SUBSTANCE RESULTS UNAVAILABLE DUE TO INTERFERING SUBSTANCE  [facility]CH RESULTS UNAVAILABLE DUE TO INTERFERING SUBSTANCE RESULTS UNAVAILABLE DUE TO INTERFERING SUBSTANCE  [facility] RESULTS UNAVAILABLE DUE TO INTERFERING SUBSTANCE RESULTS UNAVAILABLE DUE TO INTERFERING SUBSTANCE  RDW RESULTS UNAVAILABLE DUE TO INTERFERING SUBSTANCE RESULTS UNAVAILABLE DUE TO INTERFERING SUBSTANCE  PLT 132* 137*    BNP Recent Labs  Lab 12/05/2019 1511 12/27/19 0340  BNP 918.3* 1,349.3*     DDimer No results for input(s): DDIMER in the last 168 hours.   Radiology    US Renal  Result Date: 12/11/2019 CLINICAL DATA:   Acute renal failure EXAM: RENAL / URINARY TRACT ULTRASOUND COMPLETE COMPARISON:  Ultrasound report kidney 12/09/2008 FINDINGS: Right Kidney: Renal measurements: 11.4 x 5.1 x 5.3 cm = volume: 161.2 mL. Cortex is echogenic. No mass. No hydronephrosis Left Kidney: Renal measurements: 11.7 x 6.2 x 5 cm = volume: 187.1 mL. Cortex is echogenic. There is mild hydronephrosis. Complex cystic mass at the upper pole measuring 1.6 x 1.4 x 1.3 cm with multiple septations. Bladder: Not well visualized and likely empty. Other: Suspect subtle contour nodularity of the liver. Small moderate perihepatic ascites. IMPRESSION: 1. Echogenic kidneys bilaterally suggesting medical renal disease. There is mild left hydronephrosis. 2. 1.6 cm complex septated cyst in the upper pole left kidney. When the patient is clinically stable and able to follow directions and hold their breath (preferably as an outpatient) further evaluation with dedicated abdominal MRI should be considered. 3. Possible cirrhotic morphology of liver. Small moderate perihepatic ascites Electronically Signed   By: Donavan Foil M.D.   On: 12/13/2019 17:34   DG Chest Port 1 View  Result Date: 12/27/2019 CLINICAL DATA:  Acute respiratory failure. EXAM: PORTABLE CHEST 1 VIEW COMPARISON:  Chest radiograph 12/07/2019. FINDINGS: Left IJ central venous catheter tip projects over the superior vena cava. Monitoring leads overlie the patient. Stable cardiac and mediastinal contours. Minimal heterogeneous opacities lung bases bilaterally. Probable small right pleural effusion. No pneumothorax. IMPRESSION: Bibasilar atelectasis. Probable small right pleural effusion. Electronically Signed   By: Lovey Newcomer M.D.   On: 12/27/2019 08:46   DG Chest Port 1 View  Result Date: 01/01/2020 CLINICAL DATA:  Central line placement EXAM: PORTABLE CHEST 1 VIEW COMPARISON:  12/27/2019 FINDINGS: Left-sided central venous catheter tip overlies the SVC origin. No pneumothorax. Cardiomegaly  with vascular congestion. New hazy pulmonary opacities suggestive of pleural effusions or edema. This is more prominent on the right side. IMPRESSION: 1. Left-sided central venous catheter tip overlies the SVC origin. No pneumothorax. 2. New hazy pulmonary opacities suggestive of pleural effusions and/or edema. Electronically Signed   By: Donavan Foil M.D.   On: 12/31/2019 20:43   DG Chest Port 1 View  Result Date: 12/28/2019 CLINICAL DATA:  Hypotension EXAM: PORTABLE CHEST 1 VIEW COMPARISON:  10/25/2013 FINDINGS: Mild cardiomegaly. Minimal atelectasis left base. No consolidation or effusion. No pneumothorax. IMPRESSION: Mild cardiomegaly. Minimal atelectasis at the left base. Electronically Signed   By: Donavan Foil M.D.   On: 12/13/2019 15:47   ECHOCARDIOGRAM COMPLETE  Result Date: 12/27/2019    ECHOCARDIOGRAM REPORT   Patient Name:   BANE HAGY Date of Exam: 12/29/2019 Medical Rec #:  [id number]        Height:       73.0 in Accession #:    [id number]       Weight:  227.0 lb Date of Birth:  [birth year]         BSA:          2.271 m Patient Age:    58 years         BP:           66/53 mmHg Patient Gender: M                HR:           92 bpm. Exam Location:  [facility] Procedure: 2D Echo, Cardiac Doppler, Color Doppler and Strain Analysis Indications:     I24.9 Acute Coronary Syndrome  History:         Patient has no prior history of Echocardiogram examinations.                  Risk Factors:Hypertension, Diabetes and Dyslipidemia. Sleep                  apnea.  Sonographer:     Wilford Sports Rodgers-Jones Referring Phys:  [id number] [place] Diagnosing Phys: Kate Sable MD IMPRESSIONS  1. LV GLS is globally abnormal with apical sparing consistent with cardiac amyloidosis. Left ventricular ejection fraction, by estimation, is 60 to 65%. The left ventricle has normal function. The left ventricle has no regional wall motion abnormalities. There is severe concentric left ventricular hypertrophy.  Left ventricular diastolic parameters are consistent with Grade II diastolic dysfunction (pseudonormalization). The average left ventricular global longitudinal strain is -15.5 %. The global longitudinal strain is abnormal.  2. Right ventricular systolic function is normal. The right ventricular size is normal. Moderately increased right ventricular wall thickness.  3. Left atrial size was mildly dilated.  4. Right atrial size was mild to moderately dilated.  5. The mitral valve is normal in structure. No evidence of mitral valve regurgitation.  6. The aortic valve is tricuspid. Aortic valve regurgitation is not visualized. Mild aortic valve sclerosis is present, with no evidence of aortic valve stenosis.  7. The inferior vena cava is normal in size with greater than 50% respiratory variability, suggesting right atrial pressure of 3 mmHg. Conclusion(s)/Recommendation(s): Findings consistent with cardiac amyloid. LV GLS is globally abnormal with apical sparing. FINDINGS  Left Ventricle: LV GLS is globally abnormal with apical sparing consistent with cardiac amyloidosis. Left ventricular ejection fraction, by estimation, is 60 to 65%. The left ventricle has normal function. The left ventricle has no regional wall motion abnormalities. The average left ventricular global longitudinal strain is -15.5 %. The global longitudinal strain is abnormal. The left ventricular internal cavity size was small. There is severe concentric left ventricular hypertrophy. Left ventricular diastolic parameters are consistent with Grade II diastolic dysfunction (pseudonormalization). Right Ventricle: The right ventricular size is normal. Moderately increased right ventricular wall thickness. Right ventricular systolic function is normal. Left Atrium: Left atrial size was mildly dilated. Right Atrium: Right atrial size was mild to moderately dilated. Pericardium: There is no evidence of pericardial effusion. Mitral Valve: The mitral valve  is normal in structure. No evidence of mitral valve regurgitation. Tricuspid Valve: The tricuspid valve is grossly normal. Tricuspid valve regurgitation is trivial. Aortic Valve: The aortic valve is tricuspid. Aortic valve regurgitation is not visualized. Mild aortic valve sclerosis is present, with no evidence of aortic valve stenosis. Pulmonic Valve: The pulmonic valve was not well visualized. Pulmonic valve regurgitation is not visualized. Aorta: The aortic root is normal in size and structure. Venous: The inferior vena cava is normal in size with  greater than 50% respiratory variability, suggesting right atrial pressure of 3 mmHg. IAS/Shunts: No atrial level shunt detected by color flow Doppler.  LEFT VENTRICLE PLAX 2D LVIDd:         4.14 cm  Diastology LVIDs:         2.50 cm  LV e' medial:    4.57 cm/s LV PW:         1.93 cm  LV E/e' medial:  23.2 LV IVS:        1.90 cm  LV e' lateral:   5.11 cm/s LVOT diam:     2.40 cm  LV E/e' lateral: 20.7 LV SV:         118 LV SV Index:   52       2D Longitudinal Strain LVOT Area:     4.52 cm 2D Strain GLS Avg:     -15.5 %  RIGHT VENTRICLE             IVC RV Basal diam:  4.25 cm     IVC diam: 1.89 cm RV S prime:     14.00 cm/s TAPSE (M-mode): 2.0 cm LEFT ATRIUM             Index       RIGHT ATRIUM           Index LA diam:        5.40 cm 2.38 cm/m  RA Area:     26.90 cm LA Vol (A2C):   50.6 ml 22.28 ml/m RA Volume:   108.00 ml 47.56 ml/m LA Vol (A4C):   63.2 ml 27.83 ml/m LA Biplane Vol: 58.3 ml 25.68 ml/m  AORTIC VALVE LVOT Vmax:   176.00 cm/s LVOT Vmean:  140.000 cm/s LVOT VTI:    0.260 m  AORTA Ao Root diam: 3.30 cm MITRAL VALVE MV Area (PHT): 3.72 cm     SHUNTS MV Decel Time: 204 msec     Systemic VTI:  0.26 m MV E velocity: 106.00 cm/s  Systemic Diam: 2.40 cm MV A velocity: 122.00 cm/s MV E/A ratio:  0.87 Kate Sable MD Electronically signed by Kate Sable MD Signature Date/Time: 12/27/2019/11:26:48 AM    Final    US ABDOMEN LIMITED RUQ  (LIVER/GB)  Result Date: 12/05/2019 CLINICAL DATA:  69 year old male with hyperbilirubinemia. EXAM: ULTRASOUND ABDOMEN LIMITED RIGHT UPPER QUADRANT COMPARISON:  None. FINDINGS: Gallbladder: The gallbladder is filled with sludge. There is no gallbladder wall thickening. Negative sonographic Murphy's sign. Common bile duct: Diameter: 4 mm Liver: The liver demonstrates a coarsened echotexture. There is mild surface irregularity concerning for changes of cirrhosis. Portal vein is patent on color Doppler imaging with normal direction of blood flow towards the liver. Other: Small ascites.  Partially visualized right pleural effusion. IMPRESSION: 1. Sludge filled gallbladder without sonographic findings of acute cholecystitis. 2. Probable cirrhosis.  Clinical correlation is recommended. 3. Patent main portal vein with hepatopetal flow. 4. Small ascites and right pleural effusion. Electronically Signed   By: Anner Crete M.D.   On: 12/06/2019 17:32    Cardiac Studies   Echo 12/17/2019 1. LV GLS is globally abnormal with apical sparing consistent with  cardiac amyloidosis. Left ventricular ejection fraction, by estimation, is  60 to 65%. The left ventricle has normal function. The left ventricle has  no regional wall motion  abnormalities. There is severe concentric left ventricular hypertrophy.  Left ventricular diastolic parameters are consistent with Grade II  diastolic dysfunction (pseudonormalization). The average left ventricular  global  longitudinal strain is -15.5 %.  The global longitudinal strain is abnormal.  2. Right ventricular systolic function is normal. The right ventricular  size is normal. Moderately increased right ventricular wall thickness.  3. Left atrial size was mildly dilated.  4. Right atrial size was mild to moderately dilated.  5. The mitral valve is normal in structure. No evidence of mitral valve  regurgitation.  6. The aortic valve is tricuspid. Aortic valve  regurgitation is not  visualized. Mild aortic valve sclerosis is present, with no evidence of  aortic valve stenosis.  7. The inferior vena cava is normal in size with greater than 50%  respiratory variability, suggesting right atrial pressure of 3 mmHg.   Patient Profile     69 y.o. male with history of multiple myeloma, cardiac amyloid presenting with hypotension, fatigue, edema.  Echo consistent with cardiac amyloidosis.  Assessment & Plan    1. AL cardiac amyloid -On chemo as outpatient for multiple myeloma -Echo showing diastolic dysfunction, severe LVH consistent with cardiac amyloid -Diuretics on hold due to hypotension requiring 3 pressors -Dialysis being considered by family -Continue midodrine for BP support -IV levo, phenylephrine for additional BP support. -Prognosis overall remains guarded  2.  Acute kidney injury -Nephrology following -CRRT may be considered  3.  Elevated troponins -Heparin x48 hours-continue till tomorrow then stop -Invasive testing pending clinical course, unlikely to pursue due to renal dysfunction  4.  Multiple myeloma -Outpatient chemo   Critical care time was exclusive of separate billable procedures and treating other patients.  Critial care time was spent personally by me (independant of midlevel providers) on the following activities: development of treatment plan,  evaluation of patients response to treatment, examining patient, reviewing treatments/ interventions, lab studies, radiographic studies, pulse ox, discussing plan with patient, wife, family.      The patient is critically ill with multiple organ systems failure and requires high complexity decision making for assessment and support, frequent evaluation and titration of therapies, application of advanced monitoring technologies and extensive interpretation of databases.   Critical care was necessary to treat or prevent immintent or life-threatening deterioration.     Total CCT  spent directly with the patient today is 38mns       Signed, BKate Sable MD  12/27/2019, 12:19 PM

## 2019-12-27 NOTE — Progress Notes (Signed)
ANTICOAGULATION CONSULT NOTE  Pharmacy Consult for Heparin infusion Indication: ACS/STEMI  No Known Allergies  Patient Measurements: Height: 6\' 1"  (185.4 cm) Weight: 109.7 kg (241 lb 13.5 oz) IBW/kg (Calculated) : 79.9 Heparin Dosing Weight: 100.8 kg  Vital Signs: Temp: 97.6 F (36.4 C) (11/25 1200) Temp Source: Oral (11/25 1200) BP: 100/64 (11/25 1400) Pulse Rate: 89 (11/25 1400)  Labs: Recent Labs    12/24/2019 1511 12/14/2019 1741 12/21/2019 2342 12/27/19 0340 12/27/19 0549 12/27/19 0753 12/27/19 1329  HGB 12.5*  --   --  12.2*  --   --   --   HCT RESULTS UNAVAILABLE DUE TO INTERFERING SUBSTANCE  --   --  RESULTS UNAVAILABLE DUE TO INTERFERING SUBSTANCE  --   --   --   PLT 132*  --   --  137*  --   --   --   APTT 53*  --   --   --   --   --   --   LABPROT 16.5*  --   --   --   --   --   --   INR 1.4*  --   --   --   --   --   --   HEPARINUNFRC  --   --   --  0.22*  --   --  0.33  CREATININE 6.15*  --  6.19* 6.19*  --   --   --   TROPONINIHS 1,363*   < >  --  2,520* 2,584* 2,724*  --    < > = values in this interval not displayed.    Estimated Creatinine Clearance: 14.4 mL/min (A) (by C-G formula based on SCr of 6.19 mg/dL (H)).  Assessment:  69 y.o. male presenting with hypotension resulting in general malaise.  BNP 918, HS Troponin 1363, LA 7.1  11/25 0340 HL 0.22  11/25 1329 HL 0.33    Goal of Therapy:  Heparin level 0.3-0.7 units/ml Monitor platelets by anticoagulation protocol: Yes   Plan:  Heparin level is therapeutic.  Will continueHeparin infusion at 1600 units/hr and recheck HL in 8 hours  Eleonore Chiquito, PharmD, BCPS Clinical Pharmacist  12/27/2019 2:41 PM

## 2019-12-28 ENCOUNTER — Inpatient Hospital Stay: Payer: Medicare Other

## 2019-12-28 DIAGNOSIS — I43 Cardiomyopathy in diseases classified elsewhere: Secondary | ICD-10-CM

## 2019-12-28 DIAGNOSIS — E854 Organ-limited amyloidosis: Principal | ICD-10-CM

## 2019-12-28 DIAGNOSIS — I214 Non-ST elevation (NSTEMI) myocardial infarction: Secondary | ICD-10-CM | POA: Diagnosis not present

## 2019-12-28 LAB — CBC WITH DIFFERENTIAL/PLATELET
Abs Immature Granulocytes: 0.07 10*3/uL (ref 0.00–0.07)
Basophils Absolute: 0 10*3/uL (ref 0.0–0.1)
Basophils Relative: 0 %
Eosinophils Absolute: 0 10*3/uL (ref 0.0–0.5)
Eosinophils Relative: 0 %
Hemoglobin: 12 g/dL — ABNORMAL LOW (ref 13.0–17.0)
Immature Granulocytes: 1 %
Lymphocytes Relative: 1 %
Lymphs Abs: 0.1 10*3/uL — ABNORMAL LOW (ref 0.7–4.0)
Monocytes Absolute: 1.8 10*3/uL — ABNORMAL HIGH (ref 0.1–1.0)
Monocytes Relative: 13 %
Neutro Abs: 11.7 10*3/uL — ABNORMAL HIGH (ref 1.7–7.7)
Neutrophils Relative %: 85 %
Platelets: 115 10*3/uL — ABNORMAL LOW (ref 150–400)
Smear Review: NORMAL
WBC: 13.7 10*3/uL — ABNORMAL HIGH (ref 4.0–10.5)
nRBC: 1.5 % — ABNORMAL HIGH (ref 0.0–0.2)

## 2019-12-28 LAB — MAGNESIUM: Magnesium: 2.1 mg/dL (ref 1.7–2.4)

## 2019-12-28 LAB — COMPREHENSIVE METABOLIC PANEL
ALT: 106 U/L — ABNORMAL HIGH (ref 0–44)
AST: 282 U/L — ABNORMAL HIGH (ref 15–41)
Albumin: 1.8 g/dL — ABNORMAL LOW (ref 3.5–5.0)
Alkaline Phosphatase: 444 U/L — ABNORMAL HIGH (ref 38–126)
Anion gap: 18 — ABNORMAL HIGH (ref 5–15)
BUN: 74 mg/dL — ABNORMAL HIGH (ref 8–23)
CO2: 19 mmol/L — ABNORMAL LOW (ref 22–32)
Calcium: 6.8 mg/dL — ABNORMAL LOW (ref 8.9–10.3)
Chloride: 88 mmol/L — ABNORMAL LOW (ref 98–111)
Creatinine, Ser: 6.75 mg/dL — ABNORMAL HIGH (ref 0.61–1.24)
GFR, Estimated: 8 mL/min — ABNORMAL LOW (ref >60)
Glucose, Bld: 150 mg/dL — ABNORMAL HIGH (ref 70–99)
Potassium: 4.9 mmol/L (ref 3.5–5.1)
Sodium: 125 mmol/L — ABNORMAL LOW (ref 135–145)
Total Bilirubin: 8.6 mg/dL — ABNORMAL HIGH (ref 0.3–1.2)
Total Protein: 4.3 g/dL — ABNORMAL LOW (ref 6.5–8.1)

## 2019-12-28 LAB — GLUCOSE, CAPILLARY
Glucose-Capillary: 131 mg/dL — ABNORMAL HIGH (ref 70–99)
Glucose-Capillary: 137 mg/dL — ABNORMAL HIGH (ref 70–99)
Glucose-Capillary: 139 mg/dL — ABNORMAL HIGH (ref 70–99)
Glucose-Capillary: 143 mg/dL — ABNORMAL HIGH (ref 70–99)
Glucose-Capillary: 150 mg/dL — ABNORMAL HIGH (ref 70–99)
Glucose-Capillary: 159 mg/dL — ABNORMAL HIGH (ref 70–99)
Glucose-Capillary: 163 mg/dL — ABNORMAL HIGH (ref 70–99)

## 2019-12-28 LAB — HEPARIN LEVEL (UNFRACTIONATED): Heparin Unfractionated: 0.34 IU/mL (ref 0.30–0.70)

## 2019-12-28 LAB — URINE CULTURE: Culture: NO GROWTH

## 2019-12-28 LAB — PHOSPHORUS: Phosphorus: 7.4 mg/dL — ABNORMAL HIGH (ref 2.5–4.6)

## 2019-12-28 LAB — PROCALCITONIN: Procalcitonin: 9.43 ng/mL

## 2019-12-28 MED ORDER — PIPERACILLIN-TAZOBACTAM IN DEX 2-0.25 GM/50ML IV SOLN
2.2500 g | Freq: Four times a day (QID) | INTRAVENOUS | Status: DC
  Administered 2019-12-28 – 2019-12-30 (×8): 2.25 g via INTRAVENOUS
  Filled 2019-12-28 (×11): qty 50

## 2019-12-28 MED ORDER — MIDODRINE HCL 5 MG PO TABS
20.0000 mg | ORAL_TABLET | Freq: Three times a day (TID) | ORAL | Status: DC
  Administered 2019-12-28 – 2019-12-29 (×5): 20 mg via ORAL
  Filled 2019-12-28 (×6): qty 4

## 2019-12-28 MED ORDER — DIPHENHYDRAMINE HCL 50 MG/ML IJ SOLN
12.5000 mg | Freq: Once | INTRAMUSCULAR | Status: AC
  Administered 2019-12-28: 12.5 mg via INTRAVENOUS
  Filled 2019-12-28: qty 1

## 2019-12-28 MED ORDER — FUROSEMIDE 10 MG/ML IJ SOLN
40.0000 mg | Freq: Once | INTRAMUSCULAR | Status: AC
  Administered 2019-12-28: 40 mg via INTRAVENOUS
  Filled 2019-12-28: qty 4

## 2019-12-28 NOTE — Progress Notes (Signed)
CRITICAL CARE NOTE  CC  Follow up resp failure  HPI remains on 3 vasopressors Remains critically ill Multiorgan failure  Severe shock     BP 104/65   Pulse 93   Temp (!) 97.2 F (36.2 C) (Axillary)   Resp 18   Ht _0  (1.854 m)   Wt 94.1 kg   SpO2 93%   BMI 27.37 kg/m    I/O last 3 completed shifts: In: 5538.6 [P.O.:360; I.V.:4683.7; IV Piggyback:494.9] Out: 375 [Urine:375] Total I/O In: 730.3 [P.O.:50; I.V.:634.5; IV Piggyback:45.7] Out: 45 [Urine:45]  SpO2: 93 % O2 Flow Rate (L/min): 2 L/min  Estimated body mass index is 27.37 kg/m as calculated from the following:   Height as of this encounter: _1  (1.854 m).   Weight as of this encounter: 94.1 kg.    Review of Systems: intermittently confused +SOB+pain  Other:  All other systems negative     PHYSICAL EXAMINATION:  GENERAL:critically ill appearing,  PULMONARY: +rhonchi, +wheezing CARDIOVASCULAR: S1 and S2. Regular rate and rhythm. No murmurs, rubs, or gallops.  GASTROINTESTINAL: Soft, nontender, -distended. Positive bowel sounds.  MUSCULOSKELETAL: +edema.  NEUROLOGIC: no focal deficits SKIN:intact,warm,dry     ALL OTHER ROS ARE NEGATIVE    MEDICATIONS: I have reviewed all medications and confirmed regimen as documented   CULTURE RESULTS   Recent Results (from the past 240 hour(s))  Resp Panel by RT-PCR (Flu A&B, Covid) Nasopharyngeal Swab     Status: None   Collection Time: 12/22/2019  3:11 PM   Specimen: Nasopharyngeal Swab; Nasopharyngeal(NP) swabs in vial transport medium  Result Value Ref Range Status   SARS Coronavirus 2 by RT PCR NEGATIVE NEGATIVE Final    Comment: (NOTE) SARS-CoV-2 target nucleic acids are NOT DETECTED.  The SARS-CoV-2 RNA is generally detectable in upper respiratory specimens during the acute phase of infection. The lowest concentration of SARS-CoV-2 viral copies this assay can detect is 138 copies/mL. A negative result does not preclude  SARS-Cov-2 infection and should not be used as the sole basis for treatment or other patient management decisions. A negative result may occur with  improper specimen collection/handling, submission of specimen other than nasopharyngeal swab, presence of viral mutation(s) within the areas targeted by this assay, and inadequate number of viral copies(<138 copies/mL). A negative result must be combined with clinical observations, patient history, and epidemiological information. The expected result is Negative.  Fact Sheet for Patients:  EntrepreneurPulse.com.au  Fact Sheet for Healthcare Providers:  IncredibleEmployment.be  This test is no t yet approved or cleared by the [place] FDA and  has been authorized for detection and/or diagnosis of SARS-CoV-2 by FDA under an Emergency Use Authorization (EUA). This EUA will remain  in effect (meaning this test can be used) for the duration of the COVID-19 declaration under Section 564(b)(1) of the Act, 21 U.S.C.section 360bbb-3(b)(1), unless the authorization is terminated  or revoked sooner.       Influenza A by PCR NEGATIVE NEGATIVE Final   Influenza B by PCR NEGATIVE NEGATIVE Final    Comment: (NOTE) The Xpert Xpress SARS-CoV-2/FLU/RSV plus assay is intended as an aid in the diagnosis of influenza from Nasopharyngeal swab specimens and should not be used as a sole basis for treatment. Nasal washings and aspirates are unacceptable for Xpert Xpress SARS-CoV-2/FLU/RSV testing.  Fact Sheet for Patients: EntrepreneurPulse.com.au  Fact Sheet for Healthcare Providers: IncredibleEmployment.be  This test is not yet approved or cleared by the [place] FDA and has been authorized for detection and/or diagnosis  of SARS-CoV-2 by FDA under an Emergency Use Authorization (EUA). This EUA will remain in effect (meaning this test can be used) for the duration of  the COVID-19 declaration under Section 564(b)(1) of the Act, 21 U.S.C. section 360bbb-3(b)(1), unless the authorization is terminated or revoked.  Performed at [facility], [address] [place], [place] [address]   Blood culture (routine x 2)     Status: None (Preliminary result)   Collection Time: 12/28/2019  3:11 PM   Specimen: BLOOD  Result Value Ref Range Status   Specimen Description BLOOD BLOOD LEFT FOREARM  Final   Special Requests   Final    BOTTLES DRAWN AEROBIC AND ANAEROBIC Blood Culture adequate volume   Culture   Final    NO GROWTH 2 DAYS Performed at [facility], [address], [place], [place] [address]    Report Status PENDING  Incomplete  Urine Culture     Status: None   Collection Time: 12/23/2019  3:11 PM   Specimen: Urine, Random  Result Value Ref Range Status   Specimen Description   Final    URINE, RANDOM Performed at [facility], [address] [place], [place] [address]    Special Requests   Final    NONE Performed at [facility], [address] [place], [place] [address]    Culture   Final    NO GROWTH Performed at [facility], [place] [address], [place], [place] [address]    Report Status 12/28/2019 FINAL  Final  Blood culture (routine x 2)     Status: None (Preliminary result)   Collection Time: 12/17/2019  3:48 PM   Specimen: BLOOD  Result Value Ref Range Status   Specimen Description BLOOD LEFT ANTECUBITAL  Final   Special Requests   Final    BOTTLES DRAWN AEROBIC AND ANAEROBIC Blood Culture adequate volume   Culture   Final    NO GROWTH 2 DAYS Performed at [facility], [address], [place], [place] [address]    Report Status PENDING  Incomplete  MRSA PCR Screening     Status: None   Collection Time: 12/09/2019  7:32 PM   Specimen: Nasopharyngeal  Result Value Ref Range Status   MRSA by PCR NEGATIVE NEGATIVE Final    Comment:        The GeneXpert MRSA Assay (FDA approved  for NASAL specimens only), is one component of a comprehensive MRSA colonization surveillance program. It is not intended to diagnose MRSA infection nor to guide or monitor treatment for MRSA infections. Performed at [facility], [address] [place],  [address]           IMAGING    DG Abd 1 View  Result Date: 12/28/2019 CLINICAL DATA:  Abdominal distention. EXAM: ABDOMEN - 1 VIEW COMPARISON:  Chest x-ray 12/27/2019.  Ultrasound 12/31/2019. FINDINGS: Motion artifact noted. Mid abdomen incompletely imaged. No bowel distention or free air noted. Degenerative change thoracolumbar spine and both hips. IMPRESSION: Limited exam as above.  No bowel distention or free air noted. Electronically Signed   By: Marcello Moores  Register   On: 12/28/2019 05:36        Indwelling Urinary Catheter continued, requirement due to   Reason to continue Indwelling Urinary Catheter strict Intake/Output monitoring for hemodynamic instability   Central Line/ continued, requirement due to  Reason to continue [place] of central venous pressure or other hemodynamic parameters and poor IV access  ASSESSMENT AND PLAN SYNOPSIS  69 yo AAM admitted to ICU for severe shock(cardiogenic and septic shock) with progressive multiorgan failure with renal failure in the setting of progressive end stage cardiac/liver amyloidosis with underlying end stage Multiple myeloma   ACUTE DIASTOLIC CARDIAC FAILURE -oxygen as needed -Lasix as tolerated -follow up cardiac enzymes as indicated -follow up cardiology recs Vasopressors as needed   SHOCK-CARDIOGENIC/SEPTIC SHOCK Continue pressors Continue IV abx Stress dose steroids Continue midodrine   ACUTE KIDNEY INJURY/Renal Failure -continue Foley Catheter-assess need -Avoid nephrotoxic agents -Follow urine output, BMP -Ensure adequate renal perfusion, optimize oxygenation -Renal dose medications Patient refuses  HD   CARDIAC ICU monitoring  ID -continue IV abx as prescibed -follow up cultures       DIET--> as tolerated Constipation protocol as indicated  ENDO - ICU hypoglycemic\Hyperglycemia protocol -check FSBS per protocol  ELECTROLYTES -follow labs as needed -replace as needed -pharmacy consultation and following   DVT/GI PRX ordered and assessed TRANSFUSIONS AS NEEDED MONITOR FSBS I Assessed the need for Labs I Assessed the need for Foley I Assessed the need for Central Venous Line Family Discussion when available I Assessed the need for Mobilization I made an Assessment of medications to be adjusted accordingly Safety Risk assessment completed  CASE DISCUSSED IN MULTIDISCIPLINARY ROUNDS WITH ICU TEAM      Critical Care Time devoted to patient care services described in this note is 55 minutes.   Overall, patient is critically ill, prognosis is guarded.  Patient with Multiorgan failure and at high risk for cardiac arrest and death.   Patient is DNR/DNI  Corrin Parker, M.D.  [facility]  Medical Director [place] Director [facility] Cardio-Pulmonary Department

## 2019-12-28 NOTE — Consult Note (Signed)
Pharmacy Antibiotic Note  Mason Johnson is a 69 y.o. male with medical history including multiple myeloma on chemotherapy, cardiac amyloid admitted on 12/10/2019 with sepsis. Patient presented to the ED 11/24 with hypotension, lethargy, weakness. Labs notable for Scr 6.15 (was 2.8 on 11/21), troponin 1363, lactic acid 7.1, PCT 4.24, elevated transaminases. Vitals with hypotension. ECHO is pending. Pharmacy has been consulted for Zosyn dosing.  Plan: Day 3 of abx. Will change to Zosyn 2.25g IV q6h (CrCl <20, but not on HD)  Will continue to monitor renal function and adjust antibiotics as indicated. Continue to monitor culture results and narrow antibiotics as able.  Height: '6\' 1"'  (185.4 cm) Weight: 94.1 kg (207 lb 7.3 oz) IBW/kg (Calculated) : 79.9  Temp (24hrs), Avg:97.7 F (36.5 C), Min:97.3 F (36.3 C), Max:98.1 F (36.7 C)  Recent Labs  Lab 12/10/2019 1511 12/19/2019 1741 01/01/2020 2203 12/21/2019 2342 12/27/19 0340 12/28/19 0405  WBC 10.4  --   --   --  12.2* 13.7*  CREATININE 6.15*  --   --  6.19* 6.19* 6.75*  LATICACIDVEN 7.1* 7.5* 5.9*  --  7.3*  --     Estimated Creatinine Clearance: 11.5 mL/min (A) (by C-G formula based on SCr of 6.75 mg/dL (H)).    No Known Allergies  Antimicrobials this admission: Cefepime (11/24 x 1) Zosyn (11/24 >>    Microbiology results: 11/24 BCx: pending 11/24 UCx: pending  11/24 MRSA PCR: negative  Thank you for allowing pharmacy to be a part of this patient's care.  Shanon Brow [place] 12/28/2019 11:57 AM

## 2019-12-28 NOTE — Progress Notes (Signed)
Progress Note  Patient Name: Mason Johnson Date of Encounter: 12/28/2019  [facility] Cardiologist: new- Dr. Garen Lah rounding [facility] cardiology- outpatient)  Subjective   Patient staying in good spirits.  Wife at bedside.  No acute events overnight.  Still requiring 3 pressors to maintain blood pressure.  Patient did not want dialysis.  Inpatient Medications    Scheduled Meds: . atorvastatin  40 mg Oral Daily  . Chlorhexidine Gluconate Cloth  6 each Topical Daily  . fluocinonide cream  1 application Topical UD  . hydrocortisone sod succinate (SOLU-CORTEF) inj  50 mg Intravenous Q6H  . latanoprost  1 drop Both Eyes QHS  . lidocaine  5 mL Intradermal Once  . midodrine  15 mg Oral TID WC  . pantoprazole  40 mg Oral Daily  . sodium chloride flush  3 mL Intravenous Q12H  . tamsulosin  0.4 mg Oral Daily   Continuous Infusions: . sodium chloride    . famotidine (PEPCID) IV    . heparin 1,600 Units/hr (12/28/19 0645)  . norepinephrine (LEVOPHED) Adult infusion 40 mcg/min (12/28/19 0645)  . phenylephrine (NEO-SYNEPHRINE) Adult infusion 350 mcg/min (12/28/19 0645)  . piperacillin-tazobactam (ZOSYN)  IV    . vasopressin 0.04 Units/min (12/28/19 0935)   PRN Meds: sodium chloride, acetaminophen, docusate sodium, ondansetron (ZOFRAN) IV, polyethylene glycol, sodium chloride flush   Vital Signs    Vitals:   12/28/19 0700 12/28/19 0800 12/28/19 0830 12/28/19 0900  BP: 104/66 (!) 104/54 108/70 107/70  Pulse: 93 94 92 88  Resp: '15 12 16 13  ' Temp:   97.9 F (36.6 C)   TempSrc:   Oral   SpO2: 91% 92% 92% 93%  Weight:      Height:        Intake/Output Summary (Last 24 hours) at 12/28/2019 0943 Last data filed at 12/28/2019 0947 Gross per 24 hour  Intake 3185.15 ml  Output 385 ml  Net 2800.15 ml   Last 3 Weights 12/28/2019 12/27/2019 01/01/2020  Weight (lbs) 207 lb 7.3 oz 241 lb 13.5 oz 227 lb  Weight (kg) 94.1 kg 109.7 kg 102.967 kg      Telemetry    Sinus  rhythm- Personally Reviewed  ECG    No new tracing- Personally Reviewed  Physical Exam   GEN:  Mild respiratory distress Neck: No JVD Cardiac: RRR, no murmurs, rubs, or gallops.  Respiratory:  Diminished breath sounds at bases GI: Soft, nontender, non-distended  MS: 2+ edema; No deformity. Neuro:  Nonfocal  Psych: Normal affect   Labs    High Sensitivity Troponin:   Recent Labs  Lab 12/05/2019 1511 12/04/2019 1741 12/27/19 0340 12/27/19 0549 12/27/19 0753  TROPONINIHS 1,363* 1,382* 2,520* 2,584* 2,724*      Chemistry Recent Labs  Lab 12/25/2019 1511 12/22/2019 1511 12/03/2019 2342 12/27/19 0340 12/28/19 0405  NA 127*   < > 123* 124* 125*  K 4.8   < > 4.9 4.9 4.9  CL 87*   < > 85* 85* 88*  CO2 21*   < > 19* 18* 19*  GLUCOSE 79   < > 133* 121* 150*  BUN 64*   < > 67* 66* 74*  CREATININE 6.15*   < > 6.19* 6.19* 6.75*  CALCIUM 7.8*   < > 7.4* 7.3* 6.8*  PROT 4.2*  --   --  4.7* 4.3*  ALBUMIN 1.8*  --   --  2.0* 1.8*  AST 195*  --   --  244* 282*  ALT 81*  --   --  94* 106*  ALKPHOS 413*  --   --  464* 444*  BILITOT 7.6*  --   --  8.7* 8.6*  GFRNONAA 9*   < > 9* 9* 8*  ANIONGAP 19*   < > 19* 21* 18*   < > = values in this interval not displayed.     Hematology Recent Labs  Lab 12/24/2019 1511 12/27/19 0340 12/28/19 0405  WBC 10.4 12.2* 13.7*  RBC RESULTS UNAVAILABLE DUE TO INTERFERING SUBSTANCE RESULTS UNAVAILABLE DUE TO INTERFERING SUBSTANCE RESULTS UNAVAILABLE DUE TO INTERFERING SUBSTANCE  HGB 12.5* 12.2* 12.0*  HCT RESULTS UNAVAILABLE DUE TO INTERFERING SUBSTANCE RESULTS UNAVAILABLE DUE TO INTERFERING SUBSTANCE RESULTS UNAVAILABLE DUE TO INTERFERING SUBSTANCE  [facility] RESULTS UNAVAILABLE DUE TO INTERFERING SUBSTANCE RESULTS UNAVAILABLE DUE TO INTERFERING SUBSTANCE RESULTS UNAVAILABLE DUE TO INTERFERING SUBSTANCE  [facility] RESULTS UNAVAILABLE DUE TO INTERFERING SUBSTANCE RESULTS UNAVAILABLE DUE TO INTERFERING SUBSTANCE RESULTS UNAVAILABLE DUE TO INTERFERING SUBSTANCE    [facility] RESULTS UNAVAILABLE DUE TO INTERFERING SUBSTANCE RESULTS UNAVAILABLE DUE TO INTERFERING SUBSTANCE RESULTS UNAVAILABLE DUE TO INTERFERING SUBSTANCE  [facility]DW RESULTS UNAVAILABLE DUE TO INTERFERING SUBSTANCE RESULTS UNAVAILABLE DUE TO INTERFERING SUBSTANCE RESULTS UNAVAILABLE DUE TO INTERFERING SUBSTANCE  PLT 132* 137* 115*    BNP Recent Labs  Lab 12/04/2019 1511 12/27/19 0340  BNP 918.3* 1,349.3*     DDimer No results for input(s): DDIMER in the last 168 hours.   Radiology    DG Abd 1 View  Result Date: 12/28/2019 CLINICAL DATA:  Abdominal distention. EXAM: ABDOMEN - 1 VIEW COMPARISON:  Chest x-ray 12/27/2019.  Ultrasound 12/31/2019. FINDINGS: Motion artifact noted. Mid abdomen incompletely imaged. No bowel distention or free air noted. Degenerative change thoracolumbar spine and both hips. IMPRESSION: Limited exam as above.  No bowel distention or free air noted. Electronically Signed   By: Marcello Moores  Register   On: 12/28/2019 05:36   US Renal  Result Date: 12/22/2019 CLINICAL DATA:  Acute renal failure EXAM: RENAL / URINARY TRACT ULTRASOUND COMPLETE COMPARISON:  Ultrasound report kidney 12/09/2008 FINDINGS: Right Kidney: Renal measurements: 11.4 x 5.1 x 5.3 cm = volume: 161.2 mL. Cortex is echogenic. No mass. No hydronephrosis Left Kidney: Renal measurements: 11.7 x 6.2 x 5 cm = volume: 187.1 mL. Cortex is echogenic. There is mild hydronephrosis. Complex cystic mass at the upper pole measuring 1.6 x 1.4 x 1.3 cm with multiple septations. Bladder: Not well visualized and likely empty. Other: Suspect subtle contour nodularity of the liver. Small moderate perihepatic ascites. IMPRESSION: 1. Echogenic kidneys bilaterally suggesting medical renal disease. There is mild left hydronephrosis. 2. 1.6 cm complex septated cyst in the upper pole left kidney. When the patient is clinically stable and able to follow directions and hold their breath (preferably as an outpatient) further evaluation with  dedicated abdominal MRI should be considered. 3. Possible cirrhotic morphology of liver. Small moderate perihepatic ascites Electronically Signed   By: Donavan Foil M.D.   On: 12/03/2019 17:34   DG Chest Port 1 View  Result Date: 12/27/2019 CLINICAL DATA:  Acute respiratory failure. EXAM: PORTABLE CHEST 1 VIEW COMPARISON:  Chest radiograph 12/23/2019. FINDINGS: Left IJ central venous catheter tip projects over the superior vena cava. Monitoring leads overlie the patient. Stable cardiac and mediastinal contours. Minimal heterogeneous opacities lung bases bilaterally. Probable small right pleural effusion. No pneumothorax. IMPRESSION: Bibasilar atelectasis. Probable small right pleural effusion. Electronically Signed   By: Lovey Newcomer M.D.   On: 12/27/2019 08:46   DG Chest Port 1 View  Result Date: 01/01/2020 CLINICAL  DATA:  Central line placement EXAM: PORTABLE CHEST 1 VIEW COMPARISON:  12/22/2019 FINDINGS: Left-sided central venous catheter tip overlies the SVC origin. No pneumothorax. Cardiomegaly with vascular congestion. New hazy pulmonary opacities suggestive of pleural effusions or edema. This is more prominent on the right side. IMPRESSION: 1. Left-sided central venous catheter tip overlies the SVC origin. No pneumothorax. 2. New hazy pulmonary opacities suggestive of pleural effusions and/or edema. Electronically Signed   By: Donavan Foil M.D.   On: 12/11/2019 20:43   DG Chest Port 1 View  Result Date: 12/24/2019 CLINICAL DATA:  Hypotension EXAM: PORTABLE CHEST 1 VIEW COMPARISON:  10/25/2013 FINDINGS: Mild cardiomegaly. Minimal atelectasis left base. No consolidation or effusion. No pneumothorax. IMPRESSION: Mild cardiomegaly. Minimal atelectasis at the left base. Electronically Signed   By: Donavan Foil M.D.   On: 12/27/2019 15:47   ECHOCARDIOGRAM COMPLETE  Result Date: 12/27/2019    ECHOCARDIOGRAM REPORT   Patient Name:   Mason Johnson Date of Exam: 12/20/2019 Medical Rec #:   [id number]        Height:       73.0 in Accession #:    [id number]       Weight:       227.0 lb Date of Birth:  [birth year]         BSA:          2.271 m Patient Age:    69 years         BP:           66/53 mmHg Patient Gender: M                HR:           92 bpm. Exam Location:  [facility] Procedure: 2D Echo, Cardiac Doppler, Color Doppler and Strain Analysis Indications:     I24.9 Acute Coronary Syndrome  History:         Patient has no prior history of Echocardiogram examinations.                  Risk Factors:Hypertension, Diabetes and Dyslipidemia. Sleep                  apnea.  Sonographer:     Wilford Sports Rodgers-Jones Referring Phys:  [id number] [place] Diagnosing Phys: Kate Sable MD IMPRESSIONS  1. LV GLS is globally abnormal with apical sparing consistent with cardiac amyloidosis. Left ventricular ejection fraction, by estimation, is 60 to 65%. The left ventricle has normal function. The left ventricle has no regional wall motion abnormalities. There is severe concentric left ventricular hypertrophy. Left ventricular diastolic parameters are consistent with Grade II diastolic dysfunction (pseudonormalization). The average left ventricular global longitudinal strain is -15.5 %. The global longitudinal strain is abnormal.  2. Right ventricular systolic function is normal. The right ventricular size is normal. Moderately increased right ventricular wall thickness.  3. Left atrial size was mildly dilated.  4. Right atrial size was mild to moderately dilated.  5. The mitral valve is normal in structure. No evidence of mitral valve regurgitation.  6. The aortic valve is tricuspid. Aortic valve regurgitation is not visualized. Mild aortic valve sclerosis is present, with no evidence of aortic valve stenosis.  7. The inferior vena cava is normal in size with greater than 50% respiratory variability, suggesting right atrial pressure of 3 mmHg. Conclusion(s)/Recommendation(s): Findings consistent with cardiac  amyloid. LV GLS is globally abnormal with apical sparing. FINDINGS  Left Ventricle: LV GLS is globally abnormal  with apical sparing consistent with cardiac amyloidosis. Left ventricular ejection fraction, by estimation, is 60 to 65%. The left ventricle has normal function. The left ventricle has no regional wall motion abnormalities. The average left ventricular global longitudinal strain is -15.5 %. The global longitudinal strain is abnormal. The left ventricular internal cavity size was small. There is severe concentric left ventricular hypertrophy. Left ventricular diastolic parameters are consistent with Grade II diastolic dysfunction (pseudonormalization). Right Ventricle: The right ventricular size is normal. Moderately increased right ventricular wall thickness. Right ventricular systolic function is normal. Left Atrium: Left atrial size was mildly dilated. Right Atrium: Right atrial size was mild to moderately dilated. Pericardium: There is no evidence of pericardial effusion. Mitral Valve: The mitral valve is normal in structure. No evidence of mitral valve regurgitation. Tricuspid Valve: The tricuspid valve is grossly normal. Tricuspid valve regurgitation is trivial. Aortic Valve: The aortic valve is tricuspid. Aortic valve regurgitation is not visualized. Mild aortic valve sclerosis is present, with no evidence of aortic valve stenosis. Pulmonic Valve: The pulmonic valve was not well visualized. Pulmonic valve regurgitation is not visualized. Aorta: The aortic root is normal in size and structure. Venous: The inferior vena cava is normal in size with greater than 50% respiratory variability, suggesting right atrial pressure of 3 mmHg. IAS/Shunts: No atrial level shunt detected by color flow Doppler.  LEFT VENTRICLE PLAX 2D LVIDd:         4.14 cm  Diastology LVIDs:         2.50 cm  LV e' medial:    4.57 cm/s LV PW:         1.93 cm  LV E/e' medial:  23.2 LV IVS:        1.90 cm  LV e' lateral:   5.11 cm/s  LVOT diam:     2.40 cm  LV E/e' lateral: 20.7 LV SV:         118 LV SV Index:   52       2D Longitudinal Strain LVOT Area:     4.52 cm 2D Strain GLS Avg:     -15.5 %  RIGHT VENTRICLE             IVC RV Basal diam:  4.25 cm     IVC diam: 1.89 cm RV S prime:     14.00 cm/s TAPSE (M-mode): 2.0 cm LEFT ATRIUM             Index       RIGHT ATRIUM           Index LA diam:        5.40 cm 2.38 cm/m  RA Area:     26.90 cm LA Vol (A2C):   50.6 ml 22.28 ml/m RA Volume:   108.00 ml 47.56 ml/m LA Vol (A4C):   63.2 ml 27.83 ml/m LA Biplane Vol: 58.3 ml 25.68 ml/m  AORTIC VALVE LVOT Vmax:   176.00 cm/s LVOT Vmean:  140.000 cm/s LVOT VTI:    0.260 m  AORTA Ao Root diam: 3.30 cm MITRAL VALVE MV Area (PHT): 3.72 cm     SHUNTS MV Decel Time: 204 msec     Systemic VTI:  0.26 m MV E velocity: 106.00 cm/s  Systemic Diam: 2.40 cm MV A velocity: 122.00 cm/s MV E/A ratio:  0.87 Kate Sable MD Electronically signed by Kate Sable MD Signature Date/Time: 12/27/2019/11:26:48 AM    Final    US ABDOMEN LIMITED RUQ (LIVER/GB)  Result Date: 12/15/2019  CLINICAL DATA:  69 year old male with hyperbilirubinemia. EXAM: ULTRASOUND ABDOMEN LIMITED RIGHT UPPER QUADRANT COMPARISON:  None. FINDINGS: Gallbladder: The gallbladder is filled with sludge. There is no gallbladder wall thickening. Negative sonographic Murphy's sign. Common bile duct: Diameter: 4 mm Liver: The liver demonstrates a coarsened echotexture. There is mild surface irregularity concerning for changes of cirrhosis. Portal vein is patent on color Doppler imaging with normal direction of blood flow towards the liver. Other: Small ascites.  Partially visualized right pleural effusion. IMPRESSION: 1. Sludge filled gallbladder without sonographic findings of acute cholecystitis. 2. Probable cirrhosis.  Clinical correlation is recommended. 3. Patent main portal vein with hepatopetal flow. 4. Small ascites and right pleural effusion. Electronically Signed   By: Anner Crete M.D.   On: 12/04/2019 17:32    Cardiac Studies   Echo 12/27/2019 1. LV GLS is globally abnormal with apical sparing consistent with  cardiac amyloidosis. Left ventricular ejection fraction, by estimation, is  60 to 65%. The left ventricle has normal function. The left ventricle has  no regional wall motion  abnormalities. There is severe concentric left ventricular hypertrophy.  Left ventricular diastolic parameters are consistent with Grade II  diastolic dysfunction (pseudonormalization). The average left ventricular  global longitudinal strain is -15.5 %.  The global longitudinal strain is abnormal.  2. Right ventricular systolic function is normal. The right ventricular  size is normal. Moderately increased right ventricular wall thickness.  3. Left atrial size was mildly dilated.  4. Right atrial size was mild to moderately dilated.  5. The mitral valve is normal in structure. No evidence of mitral valve  regurgitation.  6. The aortic valve is tricuspid. Aortic valve regurgitation is not  visualized. Mild aortic valve sclerosis is present, with no evidence of  aortic valve stenosis.  7. The inferior vena cava is normal in size with greater than 50%  respiratory variability, suggesting right atrial pressure of 3 mmHg.   Patient Profile     69 y.o. male with history of multiple myeloma, cardiac amyloid presenting with hypotension, fatigue, edema.  Echo consistent with cardiac amyloidosis.  Assessment & Plan    1. AL cardiac amyloid -Requiring 3 pressors to maintain blood pressure. -We will give dose of Lasix today 40 mg IV x1. -Continue midodrine for BP support -Mainstay of management is diuresing, trying to keep patient net even to negative as blood pressure permits. -Prognosis overall remains guarded  2.  Acute kidney injury -Nephrology following -Patient did not want dialysis.  3.  Elevated troponins -Heparin x48 hours-stop time today 4:30  PM -Invasive testing pending clinical course, unlikely to pursue other comorbidities.  4.  Multiple myeloma -Outpatient chemo   Critical care time was exclusive of separate billable procedures and treating other patients.  Critial care time was spent personally by me (independant of midlevel providers) on the following activities: development of treatment plan,  evaluation of patients response to treatment, examining patient, reviewing treatments/ interventions, lab studies, radiographic studies, pulse ox, discussing plan with patient, wife, family.      The patient is critically ill with multiple organ systems failure and requires high complexity decision making for assessment and support, frequent evaluation and titration of therapies, application of advanced monitoring technologies and extensive interpretation of databases.   Critical care was necessary to treat or prevent immintent or life-threatening deterioration.     Total CCT spent directly with the patient today is 20mns       Signed, BKate Sable MD  12/28/2019, 9:43 AM

## 2019-12-28 NOTE — Progress Notes (Signed)
[facility]  ROUNDING NOTE   Subjective:   Wife at bedside. UOP 342m.  Requiring vasopressin, phenylephrine and norepinephrine.   Objective:  Vital signs in last 24 hours:  Temp:  [97.3 F (36.3 C)-98.1 F (36.7 C)] 97.9 F (36.6 C) (11/26 0830) Pulse Rate:  [69-97] 93 (11/26 1030) Resp:  [9-29] 18 (11/26 1030) BP: (74-113)/(45-95) 103/77 (11/26 1030) SpO2:  [91 %-99 %] 93 % (11/26 1030) Weight:  [94.1 kg] 94.1 kg (11/26 0400)  Weight change: -8.866 kg Filed Weights   12/09/2019 1455 12/27/19 0500 12/28/19 0400  Weight: 103 kg 109.7 kg 94.1 kg    Intake/Output: I/O last 3 completed shifts: In: 5538.6 [P.O.:360; I.V.:4683.7; IV Piggyback:494.9] Out: 375 [Urine:375]   Intake/Output this shift:  Total I/O In: 402.8 [P.O.:50; I.V.:352.8] Out: 35 [Urine:35]  Physical Exam: General: NAD,   Head: Normocephalic, atraumatic. Moist oral mucosal membranes  Eyes: Anicteric, PERRL  Neck: Supple, trachea midline  Lungs:  Clear to auscultation  Heart: Regular rate and rhythm  Abdomen:  Soft, nontender,   Extremities:  no peripheral edema.  Neurologic: Nonfocal, moving all four extremities  Skin: No lesions        Basic Metabolic Panel: Recent Labs  Lab 12/24/2019 1511 12/13/2019 1511 12/04/2019 2342 12/27/19 0340 12/28/19 0405  NA 127*  --  123* 124* 125*  K 4.8  --  4.9 4.9 4.9  CL 87*  --  85* 85* 88*  CO2 21*  --  19* 18* 19*  GLUCOSE 79  --  133* 121* 150*  BUN 64*  --  67* 66* 74*  CREATININE 6.15*  --  6.19* 6.19* 6.75*  CALCIUM 7.8*   < > 7.4* 7.3* 6.8*  MG 2.2  --   --  2.2 2.1  PHOS  --   --   --  6.5* 7.4*   < > = values in this interval not displayed.    Liver Function Tests: Recent Labs  Lab 12/31/2019 1511 12/27/19 0340 12/28/19 0405  AST 195* 244* 282*  ALT 81* 94* 106*  ALKPHOS 413* 464* 444*  BILITOT 7.6* 8.7* 8.6*  PROT 4.2* 4.7* 4.3*  ALBUMIN 1.8* 2.0* 1.8*   No results for input(s): LIPASE, AMYLASE in the last 168 hours. No  results for input(s): AMMONIA in the last 168 hours.  CBC: Recent Labs  Lab 12/24/2019 1511 12/27/19 0340 12/28/19 0405  WBC 10.4 12.2* 13.7*  NEUTROABS  --  10.0* 11.7*  HGB 12.5* 12.2* 12.0*  HCT RESULTS UNAVAILABLE DUE TO INTERFERING SUBSTANCE RESULTS UNAVAILABLE DUE TO INTERFERING SUBSTANCE RESULTS UNAVAILABLE DUE TO INTERFERING SUBSTANCE  MCV RESULTS UNAVAILABLE DUE TO INTERFERING SUBSTANCE RESULTS UNAVAILABLE DUE TO INTERFERING SUBSTANCE RESULTS UNAVAILABLE DUE TO INTERFERING SUBSTANCE  PLT 132* 137* 115*    Cardiac Enzymes: No results for input(s): CKTOTAL, CKMB, CKMBINDEX, TROPONINI in the last 168 hours.  BNP: Invalid input(s): POCBNP  CBG: Recent Labs  Lab 12/27/19 1611 12/27/19 1937 12/28/19 0002 12/28/19 0358 12/28/19 0732  GLUCAP 129* 160* 139* 143* 131*    Microbiology: Results for orders placed or performed during the hospital encounter of 12/18/2019  Resp Panel by RT-PCR (Flu A&B, Covid) Nasopharyngeal Swab     Status: None   Collection Time: 12/29/2019  3:11 PM   Specimen: Nasopharyngeal Swab; Nasopharyngeal(NP) swabs in vial transport medium  Result Value Ref Range Status   SARS Coronavirus 2 by RT PCR NEGATIVE NEGATIVE Final    Comment: (NOTE) SARS-CoV-2 target nucleic acids are NOT DETECTED.  The SARS-CoV-2 RNA is generally detectable in upper respiratory specimens during the acute phase of infection. The lowest concentration of SARS-CoV-2 viral copies this assay can detect is 138 copies/mL. A negative result does not preclude SARS-Cov-2 infection and should not be used as the sole basis for treatment or other patient management decisions. A negative result may occur with  improper specimen collection/handling, submission of specimen other than nasopharyngeal swab, presence of viral mutation(s) within the areas targeted by this assay, and inadequate number of viral copies(<138 copies/mL). A negative result must be combined with clinical observations,  patient history, and epidemiological information. The expected result is Negative.  Fact Sheet for Patients:  EntrepreneurPulse.com.au  Fact Sheet for Healthcare Providers:  IncredibleEmployment.be  This test is no t yet approved or cleared by the [place] FDA and  has been authorized for detection and/or diagnosis of SARS-CoV-2 by FDA under an Emergency Use Authorization (EUA). This EUA will remain  in effect (meaning this test can be used) for the duration of the COVID-19 declaration under Section 564(b)(1) of the Act, 21 U.S.C.section 360bbb-3(b)(1), unless the authorization is terminated  or revoked sooner.       Influenza A by PCR NEGATIVE NEGATIVE Final   Influenza B by PCR NEGATIVE NEGATIVE Final    Comment: (NOTE) The Xpert Xpress SARS-CoV-2/FLU/RSV plus assay is intended as an aid in the diagnosis of influenza from Nasopharyngeal swab specimens and should not be used as a sole basis for treatment. Nasal washings and aspirates are unacceptable for Xpert Xpress SARS-CoV-2/FLU/RSV testing.  Fact Sheet for Patients: EntrepreneurPulse.com.au  Fact Sheet for Healthcare Providers: IncredibleEmployment.be  This test is not yet approved or cleared by the [place] FDA and has been authorized for detection and/or diagnosis of SARS-CoV-2 by FDA under an Emergency Use Authorization (EUA). This EUA will remain in effect (meaning this test can be used) for the duration of the COVID-19 declaration under Section 564(b)(1) of the Act, 21 U.S.C. section 360bbb-3(b)(1), unless the authorization is terminated or revoked.  Performed at [facility], [address] [place], [place] [address]   Blood culture (routine x 2)     Status: None (Preliminary result)   Collection Time: 12/19/2019  3:11 PM   Specimen: BLOOD  Result Value Ref Range Status   Specimen Description BLOOD BLOOD LEFT FOREARM   Final   Special Requests   Final    BOTTLES DRAWN AEROBIC AND ANAEROBIC Blood Culture adequate volume   Culture   Final    NO GROWTH 2 DAYS Performed at [facility], [address] [place], [place] [address]    Report Status PENDING  Incomplete  Urine Culture     Status: None   Collection Time: 12/20/2019  3:11 PM   Specimen: Urine, Random  Result Value Ref Range Status   Specimen Description   Final    URINE, RANDOM Performed at [facility], [address], [place], [place] [address]    Special Requests   Final    NONE Performed at [facility], [address], [place], [place] [address]    Culture   Final    NO GROWTH Performed at [facility], [place] [address], [place], [place] [address]    Report Status 12/28/2019 FINAL  Final  Blood culture (routine x 2)     Status: None (Preliminary result)   Collection Time: 12/14/2019  3:48 PM   Specimen: BLOOD  Result Value Ref Range Status   Specimen  Description BLOOD LEFT ANTECUBITAL  Final   Special Requests   Final    BOTTLES DRAWN AEROBIC AND ANAEROBIC Blood Culture adequate volume   Culture   Final    NO GROWTH 2 DAYS Performed at [facility], [address] [place], [place] [address]    Report Status PENDING  Incomplete  MRSA PCR Screening     Status: None   Collection Time: 12/22/2019  7:32 PM   Specimen: Nasopharyngeal  Result Value Ref Range Status   MRSA by PCR NEGATIVE NEGATIVE Final    Comment:        The GeneXpert MRSA Assay (FDA approved for NASAL specimens only), is one component of a comprehensive MRSA colonization surveillance program. It is not intended to diagnose MRSA infection nor to guide or monitor treatment for MRSA infections. Performed at [facility], [address] [place],  [address]     Coagulation Studies: Recent Labs    12/18/2019 1511  LABPROT 16.5*  INR 1.4*    Urinalysis: Recent Labs    12/09/2019 1511  COLORURINE  AMBER*  LABSPEC 1.023  PHURINE 5.0  GLUCOSEU 50*  HGBUR SMALL*  BILIRUBINUR SMALL*  KETONESUR NEGATIVE  PROTEINUR 100*  NITRITE NEGATIVE  LEUKOCYTESUR NEGATIVE      Imaging: DG Abd 1 View  Result Date: 12/28/2019 CLINICAL DATA:  Abdominal distention. EXAM: ABDOMEN - 1 VIEW COMPARISON:  Chest x-ray 12/27/2019.  Ultrasound 01/01/2020. FINDINGS: Motion artifact noted. Mid abdomen incompletely imaged. No bowel distention or free air noted. Degenerative change thoracolumbar spine and both hips. IMPRESSION: Limited exam as above.  No bowel distention or free air noted. Electronically Signed   By: Marcello Moores  Register   On: 12/28/2019 05:36   US Renal  Result Date: 12/04/2019 CLINICAL DATA:  Acute renal failure EXAM: RENAL / URINARY TRACT ULTRASOUND COMPLETE COMPARISON:  Ultrasound report kidney 12/09/2008 FINDINGS: Right Kidney: Renal measurements: 11.4 x 5.1 x 5.3 cm = volume: 161.2 mL. Cortex is echogenic. No mass. No hydronephrosis Left Kidney: Renal measurements: 11.7 x 6.2 x 5 cm = volume: 187.1 mL. Cortex is echogenic. There is mild hydronephrosis. Complex cystic mass at the upper pole measuring 1.6 x 1.4 x 1.3 cm with multiple septations. Bladder: Not well visualized and likely empty. Other: Suspect subtle contour nodularity of the liver. Small moderate perihepatic ascites. IMPRESSION: 1. Echogenic kidneys bilaterally suggesting medical renal disease. There is mild left hydronephrosis. 2. 1.6 cm complex septated cyst in the upper pole left kidney. When the patient is clinically stable and able to follow directions and hold their breath (preferably as an outpatient) further evaluation with dedicated abdominal MRI should be considered. 3. Possible cirrhotic morphology of liver. Small moderate perihepatic ascites Electronically Signed   By: Donavan Foil M.D.   On: 12/28/2019 17:34   DG Chest Port 1 View  Result Date: 12/27/2019 CLINICAL DATA:  Acute respiratory failure. EXAM: PORTABLE CHEST 1  VIEW COMPARISON:  Chest radiograph 12/06/2019. FINDINGS: Left IJ central venous catheter tip projects over the superior vena cava. Monitoring leads overlie the patient. Stable cardiac and mediastinal contours. Minimal heterogeneous opacities lung bases bilaterally. Probable small right pleural effusion. No pneumothorax. IMPRESSION: Bibasilar atelectasis. Probable small right pleural effusion. Electronically Signed   By: Lovey Newcomer M.D.   On: 12/27/2019 08:46   DG Chest Port 1 View  Result Date: 12/13/2019 CLINICAL DATA:  Central line placement EXAM: PORTABLE CHEST 1 VIEW COMPARISON:  12/08/2019 FINDINGS: Left-sided central venous catheter tip overlies the SVC origin.  No pneumothorax. Cardiomegaly with vascular congestion. New hazy pulmonary opacities suggestive of pleural effusions or edema. This is more prominent on the right side. IMPRESSION: 1. Left-sided central venous catheter tip overlies the SVC origin. No pneumothorax. 2. New hazy pulmonary opacities suggestive of pleural effusions and/or edema. Electronically Signed   By: Donavan Foil M.D.   On: 12/22/2019 20:43   DG Chest Port 1 View  Result Date: 12/10/2019 CLINICAL DATA:  Hypotension EXAM: PORTABLE CHEST 1 VIEW COMPARISON:  10/25/2013 FINDINGS: Mild cardiomegaly. Minimal atelectasis left base. No consolidation or effusion. No pneumothorax. IMPRESSION: Mild cardiomegaly. Minimal atelectasis at the left base. Electronically Signed   By: Donavan Foil M.D.   On: 12/20/2019 15:47   ECHOCARDIOGRAM COMPLETE  Result Date: 12/27/2019    ECHOCARDIOGRAM REPORT   Patient Name:   DONELL TOMKINS Date of Exam: 12/18/2019 Medical Rec #:  [id number]        Height:       73.0 in Accession #:    [id number]       Weight:       227.0 lb Date of Birth:  06/27/1950         BSA:          2.271 m Patient Age:    21 years         BP:           66/53 mmHg Patient Gender: M                HR:           92 bpm. Exam Location:  [facility] Procedure: 2D Echo, Cardiac  Doppler, Color Doppler and Strain Analysis Indications:     I24.9 Acute Coronary Syndrome  History:         Patient has no prior history of Echocardiogram examinations.                  Risk Factors:Hypertension, Diabetes and Dyslipidemia. Sleep                  apnea.  Sonographer:     Wilford Sports Rodgers-Jones Referring Phys:  [id number] [place] Diagnosing Phys: Kate Sable MD IMPRESSIONS  1. LV GLS is globally abnormal with apical sparing consistent with cardiac amyloidosis. Left ventricular ejection fraction, by estimation, is 60 to 65%. The left ventricle has normal function. The left ventricle has no regional wall motion abnormalities. There is severe concentric left ventricular hypertrophy. Left ventricular diastolic parameters are consistent with Grade II diastolic dysfunction (pseudonormalization). The average left ventricular global longitudinal strain is -15.5 %. The global longitudinal strain is abnormal.  2. Right ventricular systolic function is normal. The right ventricular size is normal. Moderately increased right ventricular wall thickness.  3. Left atrial size was mildly dilated.  4. Right atrial size was mild to moderately dilated.  5. The mitral valve is normal in structure. No evidence of mitral valve regurgitation.  6. The aortic valve is tricuspid. Aortic valve regurgitation is not visualized. Mild aortic valve sclerosis is present, with no evidence of aortic valve stenosis.  7. The inferior vena cava is normal in size with greater than 50% respiratory variability, suggesting right atrial pressure of 3 mmHg. Conclusion(s)/Recommendation(s): Findings consistent with cardiac amyloid. LV GLS is globally abnormal with apical sparing. FINDINGS  Left Ventricle: LV GLS is globally abnormal with apical sparing consistent with cardiac amyloidosis. Left ventricular ejection fraction, by estimation, is 60 to 65%. The left ventricle has normal function. The  left ventricle has no regional wall  motion abnormalities. The average left ventricular global longitudinal strain is -15.5 %. The global longitudinal strain is abnormal. The left ventricular internal cavity size was small. There is severe concentric left ventricular hypertrophy. Left ventricular diastolic parameters are consistent with Grade II diastolic dysfunction (pseudonormalization). Right Ventricle: The right ventricular size is normal. Moderately increased right ventricular wall thickness. Right ventricular systolic function is normal. Left Atrium: Left atrial size was mildly dilated. Right Atrium: Right atrial size was mild to moderately dilated. Pericardium: There is no evidence of pericardial effusion. Mitral Valve: The mitral valve is normal in structure. No evidence of mitral valve regurgitation. Tricuspid Valve: The tricuspid valve is grossly normal. Tricuspid valve regurgitation is trivial. Aortic Valve: The aortic valve is tricuspid. Aortic valve regurgitation is not visualized. Mild aortic valve sclerosis is present, with no evidence of aortic valve stenosis. Pulmonic Valve: The pulmonic valve was not well visualized. Pulmonic valve regurgitation is not visualized. Aorta: The aortic root is normal in size and structure. Venous: The inferior vena cava is normal in size with greater than 50% respiratory variability, suggesting right atrial pressure of 3 mmHg. IAS/Shunts: No atrial level shunt detected by color flow Doppler.  LEFT VENTRICLE PLAX 2D LVIDd:         4.14 cm  Diastology LVIDs:         2.50 cm  LV e' medial:    4.57 cm/s LV PW:         1.93 cm  LV E/e' medial:  23.2 LV IVS:        1.90 cm  LV e' lateral:   5.11 cm/s LVOT diam:     2.40 cm  LV E/e' lateral: 20.7 LV SV:         118 LV SV Index:   52       2D Longitudinal Strain LVOT Area:     4.52 cm 2D Strain GLS Avg:     -15.5 %  RIGHT VENTRICLE             IVC RV Basal diam:  4.25 cm     IVC diam: 1.89 cm RV S prime:     14.00 cm/s TAPSE (M-mode): 2.0 cm LEFT ATRIUM              Index       RIGHT ATRIUM           Index LA diam:        5.40 cm 2.38 cm/m  RA Area:     26.90 cm LA Vol (A2C):   50.6 ml 22.28 ml/m RA Volume:   108.00 ml 47.56 ml/m LA Vol (A4C):   63.2 ml 27.83 ml/m LA Biplane Vol: 58.3 ml 25.68 ml/m  AORTIC VALVE LVOT Vmax:   176.00 cm/s LVOT Vmean:  140.000 cm/s LVOT VTI:    0.260 m  AORTA Ao Root diam: 3.30 cm MITRAL VALVE MV Area (PHT): 3.72 cm     SHUNTS MV Decel Time: 204 msec     Systemic VTI:  0.26 m MV E velocity: 106.00 cm/s  Systemic Diam: 2.40 cm MV A velocity: 122.00 cm/s MV E/A ratio:  0.87 Kate Sable MD Electronically signed by Kate Sable MD Signature Date/Time: 12/27/2019/11:26:48 AM    Final    US ABDOMEN LIMITED RUQ (LIVER/GB)  Result Date: 12/04/2019 CLINICAL DATA:  69 year old male with hyperbilirubinemia. EXAM: ULTRASOUND ABDOMEN LIMITED RIGHT UPPER QUADRANT COMPARISON:  None. FINDINGS: Gallbladder: The gallbladder is filled  with sludge. There is no gallbladder wall thickening. Negative sonographic Murphy's sign. Common bile duct: Diameter: 4 mm Liver: The liver demonstrates a coarsened echotexture. There is mild surface irregularity concerning for changes of cirrhosis. Portal vein is patent on color Doppler imaging with normal direction of blood flow towards the liver. Other: Small ascites.  Partially visualized right pleural effusion. IMPRESSION: 1. Sludge filled gallbladder without sonographic findings of acute cholecystitis. 2. Probable cirrhosis.  Clinical correlation is recommended. 3. Patent main portal vein with hepatopetal flow. 4. Small ascites and right pleural effusion. Electronically Signed   By: Anner Crete M.D.   On: 12/08/2019 17:32     Medications:   . sodium chloride    . famotidine (PEPCID) IV    . norepinephrine (LEVOPHED) Adult infusion 40 mcg/min (12/28/19 1000)  . phenylephrine (NEO-SYNEPHRINE) Adult infusion 320 mcg/min (12/28/19 1000)  . piperacillin-tazobactam (ZOSYN)  IV    . vasopressin  0.04 Units/min (12/28/19 1000)   . atorvastatin  40 mg Oral Daily  . Chlorhexidine Gluconate Cloth  6 each Topical Daily  . fluocinonide cream  1 application Topical UD  . hydrocortisone sod succinate (SOLU-CORTEF) inj  50 mg Intravenous Q6H  . latanoprost  1 drop Both Eyes QHS  . lidocaine  5 mL Intradermal Once  . midodrine  20 mg Oral TID WC  . pantoprazole  40 mg Oral Daily  . sodium chloride flush  3 mL Intravenous Q12H  . tamsulosin  0.4 mg Oral Daily   sodium chloride, acetaminophen, docusate sodium, ondansetron (ZOFRAN) IV, polyethylene glycol, sodium chloride flush  Assessment/ Plan:  Mr. Mason Johnson is a 69 y.o. black male with multiple myeloma, amyloidosis, sleep apnea, hypertension, hyperlipidemia, diabetes mellitus type II, glaucoma, BPH, who was admitted to [facility] on 12/20/2019 for Cardiogenic shock ([place]) [R57.0] Hypochloremia [E87.8] Bilirubinemia [E80.6] Hyponatremia [E87.1] Shock ([place]) [R57.9] Transaminitis [R74.01] NSTEMI (non-ST elevated myocardial infarction) ([place]) [I21.4] Elevated brain natriuretic peptide (BNP) level [R79.89] AKI (acute kidney injury) ([place]) [B01.7] Metabolic acidosis, increased anion gap [E87.2] Hypotension, unspecified hypotension type [I95.9] Atrial fibrillation, unspecified type ([place]) [I48.91]  1. Acute kidney injury with metabolic acidosis: on chronic kidney disease stage IV with baseline creatinine of 2.8, GFR of 27 on 11/21.  Requiring vasopressors.  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  Acute kidney injury secondary to acute cardiorenal syndrome.   2. 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  3. Hyponatremia: secondary to renal failure and heart failure.   Impression: Patient and family are not interested in dialysis. Will sign off. Please call with  question   LOS: 2 Tashonda Pinkus 11/26/202110:52 AM

## 2019-12-28 NOTE — Progress Notes (Signed)
ANTICOAGULATION CONSULT NOTE  Pharmacy Consult for Heparin infusion Indication: ACS/STEMI  No Known Allergies  Patient Measurements: Height: 6\' 1"  (185.4 cm) Weight: 94.1 kg (207 lb 7.3 oz) IBW/kg (Calculated) : 79.9 Heparin Dosing Weight: 100.8 kg  Vital Signs: Temp: 97.3 F (36.3 C) (11/26 0400) Temp Source: Axillary (11/26 0400) BP: 105/70 (11/26 0400) Pulse Rate: 92 (11/26 0400)  Labs: Recent Labs    12/29/2019 1511 12/12/2019 1741 12/24/2019 2342 12/27/19 0340 12/27/19 0340 12/27/19 0549 12/27/19 0753 12/27/19 1329 12/27/19 2106 12/28/19 0405  HGB 12.5*  --   --  12.2*  --   --   --   --   --   --   HCT RESULTS UNAVAILABLE DUE TO INTERFERING SUBSTANCE  --   --  RESULTS UNAVAILABLE DUE TO INTERFERING SUBSTANCE  --   --   --   --   --   --   PLT 132*  --   --  137*  --   --   --   --   --   --   APTT 53*  --   --   --   --   --   --   --   --   --   LABPROT 16.5*  --   --   --   --   --   --   --   --   --   INR 1.4*  --   --   --   --   --   --   --   --   --   HEPARINUNFRC  --   --   --  0.22*   < >  --   --  0.33 0.34 0.34  CREATININE 6.15*  --  6.19* 6.19*  --   --   --   --   --   --   TROPONINIHS 1,363*   < >  --  2,520*  --  2,584* 2,724*  --   --   --    < > = values in this interval not displayed.    Estimated Creatinine Clearance: 12.5 mL/min (A) (by C-G formula based on SCr of 6.19 mg/dL (H)).  Assessment:  69 y.o. male presenting with hypotension resulting in general malaise.  BNP 918, HS Troponin 1363, LA 7.1  11/25 0340 HL 0.22  11/25 1329 HL 0.33  11/25 2106 HL 0.34, therapeutic x 2 11/26 0405 HL 0.34, therapeutic x 3   Goal of Therapy:  Heparin level 0.3-0.7 units/ml Monitor platelets by anticoagulation protocol: Yes   Plan:  Heparin level is therapeutic.  Will continue Heparin infusion at 1600 units/hr and recheck HL in 8 hours CBC daily per protocol  Hart Robinsons, PharmD Clinical Pharmacist   12/28/2019 4:45 AM

## 2019-12-29 DIAGNOSIS — E854 Organ-limited amyloidosis: Secondary | ICD-10-CM | POA: Diagnosis not present

## 2019-12-29 DIAGNOSIS — I959 Hypotension, unspecified: Secondary | ICD-10-CM

## 2019-12-29 DIAGNOSIS — I43 Cardiomyopathy in diseases classified elsewhere: Secondary | ICD-10-CM | POA: Diagnosis not present

## 2019-12-29 DIAGNOSIS — N186 End stage renal disease: Secondary | ICD-10-CM

## 2019-12-29 DIAGNOSIS — R579 Shock, unspecified: Secondary | ICD-10-CM | POA: Diagnosis not present

## 2019-12-29 DIAGNOSIS — R570 Cardiogenic shock: Secondary | ICD-10-CM | POA: Diagnosis not present

## 2019-12-29 DIAGNOSIS — J9601 Acute respiratory failure with hypoxia: Secondary | ICD-10-CM

## 2019-12-29 DIAGNOSIS — R7401 Elevation of levels of liver transaminase levels: Secondary | ICD-10-CM

## 2019-12-29 DIAGNOSIS — J9602 Acute respiratory failure with hypercapnia: Secondary | ICD-10-CM

## 2019-12-29 DIAGNOSIS — I4891 Unspecified atrial fibrillation: Secondary | ICD-10-CM | POA: Diagnosis not present

## 2019-12-29 LAB — CBC WITH DIFFERENTIAL/PLATELET
Abs Immature Granulocytes: 0.13 10*3/uL — ABNORMAL HIGH (ref 0.00–0.07)
Basophils Absolute: 0 10*3/uL (ref 0.0–0.1)
Basophils Relative: 0 %
Eosinophils Absolute: 0 10*3/uL (ref 0.0–0.5)
Eosinophils Relative: 0 %
Hemoglobin: 12.6 g/dL — ABNORMAL LOW (ref 13.0–17.0)
Immature Granulocytes: 1 %
Lymphocytes Relative: 1 %
Lymphs Abs: 0.1 10*3/uL — ABNORMAL LOW (ref 0.7–4.0)
Monocytes Absolute: 2.5 10*3/uL — ABNORMAL HIGH (ref 0.1–1.0)
Monocytes Relative: 16 %
Neutro Abs: 12.8 10*3/uL — ABNORMAL HIGH (ref 1.7–7.7)
Neutrophils Relative %: 82 %
Platelets: 110 10*3/uL — ABNORMAL LOW (ref 150–400)
Smear Review: DECREASED
WBC: 15.6 10*3/uL — ABNORMAL HIGH (ref 4.0–10.5)

## 2019-12-29 LAB — GLUCOSE, CAPILLARY
Glucose-Capillary: 123 mg/dL — ABNORMAL HIGH (ref 70–99)
Glucose-Capillary: 123 mg/dL — ABNORMAL HIGH (ref 70–99)
Glucose-Capillary: 120 mg/dL — ABNORMAL HIGH (ref 70–99)
Glucose-Capillary: 120 mg/dL — ABNORMAL HIGH (ref 70–99)
Glucose-Capillary: 154 mg/dL — ABNORMAL HIGH (ref 70–99)

## 2019-12-29 LAB — COMPREHENSIVE METABOLIC PANEL
ALT: 121 U/L — ABNORMAL HIGH (ref 0–44)
AST: 354 U/L — ABNORMAL HIGH (ref 15–41)
Albumin: 1.6 g/dL — ABNORMAL LOW (ref 3.5–5.0)
Alkaline Phosphatase: 413 U/L — ABNORMAL HIGH (ref 38–126)
Anion gap: 17 — ABNORMAL HIGH (ref 5–15)
BUN: 85 mg/dL — ABNORMAL HIGH (ref 8–23)
CO2: 19 mmol/L — ABNORMAL LOW (ref 22–32)
Calcium: 6.3 mg/dL — CL (ref 8.9–10.3)
Chloride: 89 mmol/L — ABNORMAL LOW (ref 98–111)
Creatinine, Ser: 7.09 mg/dL — ABNORMAL HIGH (ref 0.61–1.24)
GFR, Estimated: 8 mL/min — ABNORMAL LOW (ref >60)
Glucose, Bld: 139 mg/dL — ABNORMAL HIGH (ref 70–99)
Potassium: 5 mmol/L (ref 3.5–5.1)
Sodium: 125 mmol/L — ABNORMAL LOW (ref 135–145)
Total Bilirubin: 7.6 mg/dL — ABNORMAL HIGH (ref 0.3–1.2)
Total Protein: 4.1 g/dL — ABNORMAL LOW (ref 6.5–8.1)

## 2019-12-29 LAB — PHOSPHORUS: Phosphorus: 7.8 mg/dL — ABNORMAL HIGH (ref 2.5–4.6)

## 2019-12-29 LAB — MAGNESIUM: Magnesium: 2.1 mg/dL (ref 1.7–2.4)

## 2019-12-29 MED ORDER — STERILE WATER FOR INJECTION IV SOLN
INTRAVENOUS | Status: DC
  Filled 2019-12-29 (×2): qty 850
  Filled 2019-12-29: qty 150
  Filled 2019-12-29 (×2): qty 850
  Filled 2019-12-29: qty 150

## 2019-12-29 MED ORDER — DIPHENHYDRAMINE HCL 50 MG/ML IJ SOLN
12.5000 mg | Freq: Once | INTRAMUSCULAR | Status: AC
  Administered 2019-12-29: 12.5 mg via INTRAVENOUS
  Filled 2019-12-29: qty 1

## 2019-12-29 MED ORDER — TIMOLOL MALEATE 0.25 % OP SOLN
1.0000 [drp] | Freq: Every day | OPHTHALMIC | Status: DC
  Administered 2019-12-29: 1 [drp] via OPHTHALMIC
  Filled 2019-12-29: qty 5

## 2019-12-29 MED ORDER — IPRATROPIUM-ALBUTEROL 0.5-2.5 (3) MG/3ML IN SOLN
3.0000 mL | RESPIRATORY_TRACT | Status: DC
  Administered 2019-12-29: 3 mL via RESPIRATORY_TRACT
  Filled 2019-12-29: qty 3

## 2019-12-29 MED ORDER — IPRATROPIUM-ALBUTEROL 0.5-2.5 (3) MG/3ML IN SOLN
3.0000 mL | Freq: Four times a day (QID) | RESPIRATORY_TRACT | Status: DC
  Administered 2019-12-30 (×2): 3 mL via RESPIRATORY_TRACT
  Filled 2019-12-29: qty 3

## 2019-12-29 MED ORDER — IPRATROPIUM-ALBUTEROL 0.5-2.5 (3) MG/3ML IN SOLN
RESPIRATORY_TRACT | Status: AC
  Administered 2019-12-29: 3 mL
  Filled 2019-12-29: qty 3

## 2019-12-29 NOTE — Progress Notes (Signed)
CRITICAL CARE NOTE 69 year old gentleman with history of multiple myeloma, cardiac amyloid, hyperlipidemia, diabetes who presents due to volume overload and shock  He was diagnosed with multiple myeloma in June 2021, currently on chemotherapy. 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      SIGNIFICANT EVENTS  11/24 admitted for severe shock Patient is DNR/DNI and refuses HD 11/25 remains on 3 pressors 11/26 more confused, on pressors   CC  follow up shock  SUBJECTIVE Patient remains critically ill Prognosis is guarded Multiorgan failure Patient is in dying process and suffering Patient is DNR/DNi and refuses HD    BP 104/68   Pulse 88   Temp 97.6 F (36.4 C) (Oral)   Resp 10   Ht _0  (1.854 m)   Wt 104.6 kg   SpO2 93%   BMI 30.42 kg/m    I/O last 3 completed shifts: In: 4298.6 [P.O.:460; I.V.:3538.5; IV Piggyback:300] Out: 445 [Urine:445] No intake/output data recorded.  SpO2: 93 % O2 Flow Rate (L/min): 2 L/min  Estimated body mass index is 30.42 kg/m as calculated from the following:   Height as of this encounter: _1  (1.854 m).   Weight as of this encounter: 104.6 kg.   REVIEW OF SYSTEMS  PATIENT IS UNABLE TO PROVIDE COMPLETE REVIEW OF SYSTEMS DUE TO SEVERE CRITICAL ILLNESS        PHYSICAL EXAMINATION:  GENERAL:critically ill appearing, +resp distress NECK: Supple.  PULMONARY: +rhonchi,  CARDIOVASCULAR: S1 and S2. Regular rate and rhythm. No murmurs, rubs, or gallops.  GASTROINTESTINAL: Soft, nontender, -distended.  Positive bowel sounds.   MUSCULOSKELETAL: +edema.  NEUROLOGIC:confused SKIN:intact,warm,dry  MEDICATIONS: I have reviewed all medications and confirmed regimen as documented   CULTURE RESULTS   Recent Results (from the past 240 hour(s))  Resp Panel by RT-PCR (Flu A&B, Covid) Nasopharyngeal Swab     Status: None   Collection Time: 12/22/2019  3:11 PM   Specimen: Nasopharyngeal Swab;  Nasopharyngeal(NP) swabs in vial transport medium  Result Value Ref Range Status   SARS Coronavirus 2 by RT PCR NEGATIVE NEGATIVE Final    Comment: (NOTE) SARS-CoV-2 target nucleic acids are NOT DETECTED.  The SARS-CoV-2 RNA is generally detectable in upper respiratory specimens during the acute phase of infection. The lowest concentration of SARS-CoV-2 viral copies this assay can detect is 138 copies/mL. A negative result does not preclude SARS-Cov-2 infection and should not be used as the sole basis for treatment or other patient management decisions. A negative result may occur with  improper specimen collection/handling, submission of specimen other than nasopharyngeal swab, presence of viral mutation(s) within the areas targeted by this assay, and inadequate number of viral copies(<138 copies/mL). A negative result must be combined with clinical observations, patient history, and epidemiological information. The expected result is Negative.  Fact Sheet for Patients:  EntrepreneurPulse.com.au  Fact Sheet for Healthcare Providers:  IncredibleEmployment.be  This test is no t yet approved or cleared by the [place] FDA and  has been authorized for detection and/or diagnosis of SARS-CoV-2 by FDA under an Emergency Use Authorization (EUA). This EUA will remain  in effect (meaning this test can be used) for the duration of the COVID-19 declaration under Section 564(b)(1) of the Act, 21 U.S.C.section 360bbb-3(b)(1), unless the authorization is terminated  or revoked sooner.       Influenza A by PCR NEGATIVE NEGATIVE Final   Influenza B by PCR NEGATIVE NEGATIVE Final    Comment: (NOTE) The Xpert Xpress SARS-CoV-2/FLU/RSV plus assay is intended  as an aid in the diagnosis of influenza from Nasopharyngeal swab specimens and should not be used as a sole basis for treatment. Nasal washings and aspirates are unacceptable for Xpert Xpress  SARS-CoV-2/FLU/RSV testing.  Fact Sheet for Patients: EntrepreneurPulse.com.au  Fact Sheet for Healthcare Providers: IncredibleEmployment.be  This test is not yet approved or cleared by the [place] FDA and has been authorized for detection and/or diagnosis of SARS-CoV-2 by FDA under an Emergency Use Authorization (EUA). This EUA will remain in effect (meaning this test can be used) for the duration of the COVID-19 declaration under Section 564(b)(1) of the Act, 21 U.S.C. section 360bbb-3(b)(1), unless the authorization is terminated or revoked.  Performed at [facility], [address] [place], [place] [address]   Blood culture (routine x 2)     Status: None (Preliminary result)   Collection Time: 12/11/2019  3:11 PM   Specimen: BLOOD  Result Value Ref Range Status   Specimen Description BLOOD BLOOD LEFT FOREARM  Final   Special Requests   Final    BOTTLES DRAWN AEROBIC AND ANAEROBIC Blood Culture adequate volume   Culture   Final    NO GROWTH 3 DAYS Performed at [facility], [address], [place], [place] [address]    Report Status PENDING  Incomplete  Urine Culture     Status: None   Collection Time: 01/01/2020  3:11 PM   Specimen: Urine, Random  Result Value Ref Range Status   Specimen Description   Final    URINE, RANDOM Performed at [facility], [address] [place], [place] [address]    Special Requests   Final    NONE Performed at [facility], [address], [place], [place] [address]    Culture   Final    NO GROWTH Performed at [facility], [place] [address] [place], [place] [address]    Report Status 12/28/2019 FINAL  Final  Blood culture (routine x 2)     Status: None (Preliminary result)   Collection Time: 12/13/2019  3:48 PM   Specimen: BLOOD  Result Value Ref Range Status   Specimen Description BLOOD LEFT ANTECUBITAL  Final   Special Requests   Final     BOTTLES DRAWN AEROBIC AND ANAEROBIC Blood Culture adequate volume   Culture   Final    NO GROWTH 3 DAYS Performed at [facility], [address], [place], [place] [address]    Report Status PENDING  Incomplete  MRSA PCR Screening     Status: None   Collection Time: 12/07/2019  7:32 PM   Specimen: Nasopharyngeal  Result Value Ref Range Status   MRSA by PCR NEGATIVE NEGATIVE Final    Comment:        The GeneXpert MRSA Assay (FDA approved for NASAL specimens only), is one component of a comprehensive MRSA colonization surveillance program. It is not intended to diagnose MRSA infection nor to guide or monitor treatment for MRSA infections. Performed at [facility], [address], [place], [place] [address]           IMAGING    No results found.   Nutrition Status:           Indwelling Urinary Catheter continued, requirement due to   Reason to continue Indwelling Urinary Catheter strict Intake/Output monitoring for hemodynamic instability   Central Line/ continued, requirement due to  Reason to continue [place] of central venous pressure or other hemodynamic parameters and  poor IV access   Ventilator continued, requirement due to severe respiratory failure   Ventilator Sedation RASS 0 to -2      ASSESSMENT AND PLAN SYNOPSIS  69 yo AAM admitted to ICU for severe shock(cardiogenic and septic shock) with progressive multiorgan failure with renal failure in the setting of progressive end stage cardiac/liver amyloidosis with underlying end stage Multiple myeloma   ACUTE DIASTOLIC CARDIAC FAILURE-  -oxygen as needed -Lasix as tolerated -follow up cardiac enzymes as indicated -follow up cardiology recs   ACUTE KIDNEY INJURY/Renal Failure -continue Foley Catheter-assess need -Avoid nephrotoxic agents -Follow urine output, BMP -Ensure adequate renal perfusion, optimize oxygenation -Renal dose medications No HD at this  time Will start Bicarb infusion    NEUROLOGY Acute toxic metabolic encephalopathy  SHOCK-SEPSIS/CARDIOGENIC -use vasopressors to keep MAP>65 -follow up cultures -emperic ABX stress dose steroids   CARDIAC ICU monitoring  ID -continue IV abx as prescibed -follow up cultures  GI GI PROPHYLAXIS as indicated  DIET--> as tolerated Constipation protocol as indicated  ENDO - will use ICU hypoglycemic\Hyperglycemia protocol if indicated     ELECTROLYTES -follow labs as needed -replace as needed -pharmacy consultation and following   DVT/GI PRX ordered and assessed TRANSFUSIONS AS NEEDED MONITOR FSBS I Assessed the need for Labs I Assessed the need for Foley I Assessed the need for Central Venous Line Family Discussion when available I Assessed the need for Mobilization I made an Assessment of medications to be adjusted accordingly Safety Risk assessment completed   CASE DISCUSSED IN MULTIDISCIPLINARY ROUNDS WITH ICU TEAM  Critical Care Time devoted to patient care services described in this note is 56  minutes.   Overall, patient is critically ill, prognosis is guarded.  Patient with Multiorgan failure and at high risk for cardiac arrest and death.   Patient is suffering and dying process, patient at end of life All family is visiting   Deysi Soldo Patricia Pesa, M.D.  [facility]  Medical Director [place] Director [facility] Cardio-Pulmonary Department

## 2019-12-29 NOTE — Progress Notes (Signed)
Scissors visited pt. once earlier in the morning to check in w/family and pt. and then returned later in the afternoon after observing many family members present.  Pt. says he is very sleepy, but seemed to be encouraged by presence of family at bedside.  Family (including pt.'s wife, children, and several grandchildren) gathered in ICU waiting rm. and requested [place] pray with them.  CH led family in prayer for pt.'s sense of divine presence and peace and for strength for family.  CH remains available as needed.

## 2019-12-29 NOTE — Progress Notes (Signed)
Progress Note  Patient Name: Mason Johnson Date of Encounter: 12/29/2019  Primary Cardiologist: [facility] Card[facility]  Subjective   Notes dyspnea, but stable @ rest.  No appetite.  Inpatient Medications    Scheduled Meds: . atorvastatin  40 mg Oral Daily  . Chlorhexidine Gluconate Cloth  6 each Topical Daily  . fluocinonide cream  1 application Topical UD  . hydrocortisone sod succinate (SOLU-CORTEF) inj  50 mg Intravenous Q6H  . latanoprost  1 drop Both Eyes QHS  . lidocaine  5 mL Intradermal Once  . midodrine  20 mg Oral TID WC  . pantoprazole  40 mg Oral Daily  . sodium chloride flush  3 mL Intravenous Q12H  . tamsulosin  0.4 mg Oral Daily   Continuous Infusions: . sodium chloride    . famotidine (PEPCID) IV Stopped (12/28/19 1302)  . norepinephrine (LEVOPHED) Adult infusion 40 mcg/min (12/29/19 0905)  . phenylephrine (NEO-SYNEPHRINE) Adult infusion 210 mcg/min (12/29/19 0820)  . piperacillin-tazobactam (ZOSYN)  IV Stopped (12/29/19 0549)  .  sodium bicarbonate (isotonic) infusion in sterile water 75 mL/hr at 12/29/19 0904  . vasopressin 0.04 Units/min (12/29/19 0820)   PRN Meds: sodium chloride, acetaminophen, docusate sodium, ondansetron (ZOFRAN) IV, polyethylene glycol, sodium chloride flush   Vital Signs    Vitals:   12/29/19 0500 12/29/19 0600 12/29/19 0700 12/29/19 0800  BP: 94/69 104/68 103/66 (!) 91/57  Pulse: 90 88 87 87  Resp: _0 (!) 9  Temp:    (!) 97.4 F (36.3 C)  TempSrc:    Oral  SpO2: 95% 93% 93% 93%  Weight:      Height:        Intake/Output Summary (Last 24 hours) at 12/29/2019 1135 Last data filed at 12/29/2019 0820 Gross per 24 hour  Intake 2424 ml  Output 190 ml  Net 2234 ml   Filed Weights   12/27/19 0500 12/28/19 0400 12/29/19 0400  Weight: 109.7 kg 94.1 kg 104.6 kg    Physical Exam   GEN: Well nourished, well developed, in no acute distress.  HEENT: Grossly normal.  Neck: Supple, JVD to jaw, no carotid bruits, or  masses. Cardiac: RRR, no murmurs, rubs, or gallops. No clubbing, cyanosis, 2+ bilat LE edema.  Radials 2+, DP/PT 1+ and equal bilaterally.  Respiratory:  Respirations regular and unlabored, diminished breath sounds bilat. GI: Semi-frim, nontender, BS + x 4. MS: no deformity or atrophy. Skin: warm and dry, no rash. Neuro:  Strength and sensation are intact. Psych: AAOx3.  Normal affect.  Labs    Chemistry Recent Labs  Lab 12/27/19 0340 12/28/19 0405 12/29/19 0520  NA 124* 125* 125*  K 4.9 4.9 5.0  CL 85* 88* 89*  CO2 18* 19* 19*  GLUCOSE 121* 150* 139*  BUN 66* 74* 85*  CREATININE 6.19* 6.75* 7.09*  CALCIUM 7.3* 6.8* 6.3*  PROT 4.7* 4.3* 4.1*  ALBUMIN 2.0* 1.8* 1.6*  AST 244* 282* 354*  ALT 94* 106* 121*  ALKPHOS 464* 444* 413*  BILITOT 8.7* 8.6* 7.6*  GFRNONAA 9* 8* 8*  ANIONGAP 21* 18* 17*     Hematology Recent Labs  Lab 12/27/19 0340 12/28/19 0405 12/29/19 0520  WBC 12.2* 13.7* 15.6*  RBC RESULTS UNAVAILABLE DUE TO INTERFERING SUBSTANCE RESULTS UNAVAILABLE DUE TO INTERFERING SUBSTANCE RESULTS UNAVAILABLE DUE TO INTERFERING SUBSTANCE  HGB 12.2* 12.0* 12.6*  HCT RESULTS UNAVAILABLE DUE TO INTERFERING SUBSTANCE RESULTS UNAVAILABLE DUE TO INTERFERING SUBSTANCE RESULTS UNAVAILABLE DUE TO INTERFERING SUBSTANCE  MCV RESULTS UNAVAILABLE DUE  TO INTERFERING SUBSTANCE RESULTS UNAVAILABLE DUE TO INTERFERING SUBSTANCE RESULTS UNAVAILABLE DUE TO INTERFERING SUBSTANCE  [facility] RESULTS UNAVAILABLE DUE TO INTERFERING SUBSTANCE RESULTS UNAVAILABLE DUE TO INTERFERING SUBSTANCE RESULTS UNAVAILABLE DUE TO INTERFERING SUBSTANCE  [facility] RESULTS UNAVAILABLE DUE TO INTERFERING SUBSTANCE RESULTS UNAVAILABLE DUE TO INTERFERING SUBSTANCE RESULTS UNAVAILABLE DUE TO INTERFERING SUBSTANCE  RDW RESULTS UNAVAILABLE DUE TO INTERFERING SUBSTANCE RESULTS UNAVAILABLE DUE TO INTERFERING SUBSTANCE RESULTS UNAVAILABLE DUE TO INTERFERING SUBSTANCE  PLT 137* 115* 110*    Cardiac Enzymes  Recent Labs  Lab  12/05/2019 1511 12/18/2019 1741 12/27/19 0340 12/27/19 0549 12/27/19 0753  TROPONINIHS 1,363* 1,382* 2,520* 2,584* 2,724*      BNP Recent Labs  Lab 12/18/2019 1511 12/27/19 0340  BNP 918.3* 1,349.3*     Lipids  Lab Results  Component Value Date   CHOL 133 08/29/2014   HDL 34 (L) 08/29/2014   LDLCALC 73 08/29/2014   TRIG 130 08/29/2014   CHOLHDL 3.9 08/29/2014    HbA1c  Lab Results  Component Value Date   HGBA1C 6.5 08/29/2014    Radiology    DG Abd 1 View  Result Date: 12/28/2019 CLINICAL DATA:  Abdominal distention. EXAM: ABDOMEN - 1 VIEW COMPARISON:  Chest x-ray 12/27/2019.  Ultrasound 12/12/2019. FINDINGS: Motion artifact noted. Mid abdomen incompletely imaged. No bowel distention or free air noted. Degenerative change thoracolumbar spine and both hips. IMPRESSION: Limited exam as above.  No bowel distention or free air noted. Electronically Signed   By: Marcello Moores  Register   On: 12/28/2019 05:36   US Renal  Result Date: 12/16/2019 CLINICAL DATA:  Acute renal failure EXAM: RENAL / URINARY TRACT ULTRASOUND COMPLETE COMPARISON:  Ultrasound report kidney 12/09/2008 FINDINGS: Right Kidney: Renal measurements: 11.4 x 5.1 x 5.3 cm = volume: 161.2 mL. Cortex is echogenic. No mass. No hydronephrosis Left Kidney: Renal measurements: 11.7 x 6.2 x 5 cm = volume: 187.1 mL. Cortex is echogenic. There is mild hydronephrosis. Complex cystic mass at the upper pole measuring 1.6 x 1.4 x 1.3 cm with multiple septations. Bladder: Not well visualized and likely empty. Other: Suspect subtle contour nodularity of the liver. Small moderate perihepatic ascites. IMPRESSION: 1. Echogenic kidneys bilaterally suggesting medical renal disease. There is mild left hydronephrosis. 2. 1.6 cm complex septated cyst in the upper pole left kidney. When the patient is clinically stable and able to follow directions and hold their breath (preferably as an outpatient) further evaluation with dedicated abdominal MRI  should be considered. 3. Possible cirrhotic morphology of liver. Small moderate perihepatic ascites Electronically Signed   By: Donavan Foil M.D.   On: 12/04/2019 17:34   DG Chest Port 1 View  Result Date: 12/27/2019 CLINICAL DATA:  Acute respiratory failure. EXAM: PORTABLE CHEST 1 VIEW COMPARISON:  Chest radiograph 12/07/2019. FINDINGS: Left IJ central venous catheter tip projects over the superior vena cava. Monitoring leads overlie the patient. Stable cardiac and mediastinal contours. Minimal heterogeneous opacities lung bases bilaterally. Probable small right pleural effusion. No pneumothorax. IMPRESSION: Bibasilar atelectasis. Probable small right pleural effusion. Electronically Signed   By: Lovey Newcomer M.D.   On: 12/27/2019 08:46   DG Chest Port 1 View  Result Date: 01/01/2020 CLINICAL DATA:  Central line placement EXAM: PORTABLE CHEST 1 VIEW COMPARISON:  12/25/2019 FINDINGS: Left-sided central venous catheter tip overlies the SVC origin. No pneumothorax. Cardiomegaly with vascular congestion. New hazy pulmonary opacities suggestive of pleural effusions or edema. This is more prominent on the right side. IMPRESSION: 1. Left-sided central venous catheter tip overlies the SVC  origin. No pneumothorax. 2. New hazy pulmonary opacities suggestive of pleural effusions and/or edema. Electronically Signed   By: Donavan Foil M.D.   On: 12/05/2019 20:43   DG Chest Port 1 View  Result Date: 12/09/2019 CLINICAL DATA:  Hypotension EXAM: PORTABLE CHEST 1 VIEW COMPARISON:  10/25/2013 FINDINGS: Mild cardiomegaly. Minimal atelectasis left base. No consolidation or effusion. No pneumothorax. IMPRESSION: Mild cardiomegaly. Minimal atelectasis at the left base. Electronically Signed   By: Donavan Foil M.D.   On: 12/04/2019 15:47   US ABDOMEN LIMITED RUQ (LIVER/GB)  Result Date: 12/08/2019 CLINICAL DATA:  69 year old male with hyperbilirubinemia. EXAM: ULTRASOUND ABDOMEN LIMITED RIGHT UPPER QUADRANT  COMPARISON:  None. FINDINGS: Gallbladder: The gallbladder is filled with sludge. There is no gallbladder wall thickening. Negative sonographic Murphy's sign. Common bile duct: Diameter: 4 mm Liver: The liver demonstrates a coarsened echotexture. There is mild surface irregularity concerning for changes of cirrhosis. Portal vein is patent on color Doppler imaging with normal direction of blood flow towards the liver. Other: Small ascites.  Partially visualized right pleural effusion. IMPRESSION: 1. Sludge filled gallbladder without sonographic findings of acute cholecystitis. 2. Probable cirrhosis.  Clinical correlation is recommended. 3. Patent main portal vein with hepatopetal flow. 4. Small ascites and right pleural effusion. Electronically Signed   By: Anner Crete M.D.   On: 12/07/2019 17:32    Telemetry    RSR, 1st deg AVB, PACs/PVCs - Personally Reviewed  Cardiac Studies   Echo 12/12/2019 1. LV GLS is globally abnormal with apical sparing consistent with  cardiac amyloidosis. Left ventricular ejection fraction, by estimation, is  60 to 65%. The left ventricle has normal function. The left ventricle has  no regional wall motion  abnormalities. There is severe concentric left ventricular hypertrophy.  Left ventricular diastolic parameters are consistent with Grade II  diastolic dysfunction (pseudonormalization). The average left ventricular  global longitudinal strain is -15.5 %.  The global longitudinal strain is abnormal.   2. Right ventricular systolic function is normal. The right ventricular  size is normal. Moderately increased right ventricular wall thickness.   3. Left atrial size was mildly dilated.   4. Right atrial size was mild to moderately dilated.   5. The mitral valve is normal in structure. No evidence of mitral valve  regurgitation.   6. The aortic valve is tricuspid. Aortic valve regurgitation is not  visualized. Mild aortic valve sclerosis is present, with no  evidence of  aortic valve stenosis.   7. The inferior vena cava is normal in size with greater than 50%  respiratory variability, suggesting right atrial pressure of 3 mmHg.  _____________   Patient Profile     69 y.o. male with history of multiple myeloma, cardiac amyloid presenting with hypotension, fatigue, edema.  Echo consistent with cardiac amyloidosis.  Assessment & Plan    1.  AL cardiac amyloid/Acute on chronic HFpEF:  Remains on norepi/vasopressin/phenylephrine.  Received dose of IV lasix yesterday.  Net + 2.5L in setting of poor UO/renal failure. Renal fxn worse this AM. Cont supportive care.  Consider palliative consult.  Pt seems to be leaning toward comfort measures.  2.  Hypotension:  As above, remains on 3 vasopressors w/ BPs trending 90's to low 100s.  Supportive care.  3.  AKI:  Creat cont to worsen - 7.09 today.  Nephrology following.  Pt does not want HD.  4.  Demand Ischemia/NSTEMI:  HsTrop up to 2724 on admission.  No c/p.  Echo w/ reg wma.  Completed  48 hrs of heparin yesterday.  Will consider invasive eval pending clinical recovery, though comorbidities likely prohibitive and prognosis poor.  Cont statin. No  blocker in setting of hypotension.  5.  Multiple Myeloma:  outpt chemo.  Signed, Murray Hodgkins, NP  12/29/2019, 11:35 AM    For questions or updates, please contact   Please consult [email or web address] for contact info under Cardiology/STEMI.

## 2019-12-30 DIAGNOSIS — R570 Cardiogenic shock: Secondary | ICD-10-CM | POA: Diagnosis not present

## 2019-12-30 DIAGNOSIS — I43 Cardiomyopathy in diseases classified elsewhere: Secondary | ICD-10-CM | POA: Diagnosis not present

## 2019-12-30 DIAGNOSIS — E854 Organ-limited amyloidosis: Secondary | ICD-10-CM | POA: Diagnosis not present

## 2019-12-30 LAB — CBC WITH DIFFERENTIAL/PLATELET
Abs Immature Granulocytes: 0.18 10*3/uL — ABNORMAL HIGH (ref 0.00–0.07)
Basophils Absolute: 0 10*3/uL (ref 0.0–0.1)
Basophils Relative: 0 %
Eosinophils Absolute: 0 10*3/uL (ref 0.0–0.5)
Eosinophils Relative: 0 %
Hemoglobin: 12.9 g/dL — ABNORMAL LOW (ref 13.0–17.0)
Immature Granulocytes: 1 %
Lymphocytes Relative: 1 %
Lymphs Abs: 0.2 10*3/uL — ABNORMAL LOW (ref 0.7–4.0)
Monocytes Absolute: 2.3 10*3/uL — ABNORMAL HIGH (ref 0.1–1.0)
Monocytes Relative: 16 %
Neutro Abs: 11.6 10*3/uL — ABNORMAL HIGH (ref 1.7–7.7)
Neutrophils Relative %: 82 %
Platelets: 84 10*3/uL — ABNORMAL LOW (ref 150–400)
RDW: 26.4 % — ABNORMAL HIGH (ref 11.5–15.5)
WBC: 14.3 10*3/uL — ABNORMAL HIGH (ref 4.0–10.5)

## 2019-12-30 LAB — COMPREHENSIVE METABOLIC PANEL
ALT: 123 U/L — ABNORMAL HIGH (ref 0–44)
AST: 392 U/L — ABNORMAL HIGH (ref 15–41)
Albumin: 1.4 g/dL — ABNORMAL LOW (ref 3.5–5.0)
Alkaline Phosphatase: 413 U/L — ABNORMAL HIGH (ref 38–126)
Anion gap: 17 — ABNORMAL HIGH (ref 5–15)
BUN: 91 mg/dL — ABNORMAL HIGH (ref 8–23)
CO2: 19 mmol/L — ABNORMAL LOW (ref 22–32)
Calcium: 5.8 mg/dL — CL (ref 8.9–10.3)
Chloride: 88 mmol/L — ABNORMAL LOW (ref 98–111)
Creatinine, Ser: 7.56 mg/dL — ABNORMAL HIGH (ref 0.61–1.24)
GFR, Estimated: 7 mL/min — ABNORMAL LOW (ref >60)
Glucose, Bld: 129 mg/dL — ABNORMAL HIGH (ref 70–99)
Potassium: 5.2 mmol/L — ABNORMAL HIGH (ref 3.5–5.1)
Sodium: 124 mmol/L — ABNORMAL LOW (ref 135–145)
Total Bilirubin: 7.7 mg/dL — ABNORMAL HIGH (ref 0.3–1.2)
Total Protein: 3.9 g/dL — ABNORMAL LOW (ref 6.5–8.1)

## 2019-12-30 LAB — MAGNESIUM: Magnesium: 2.3 mg/dL (ref 1.7–2.4)

## 2019-12-30 LAB — GLUCOSE, CAPILLARY: Glucose-Capillary: 135 mg/dL — ABNORMAL HIGH (ref 70–99)

## 2019-12-30 LAB — PHOSPHORUS: Phosphorus: 8 mg/dL — ABNORMAL HIGH (ref 2.5–4.6)

## 2019-12-30 MED ORDER — POLYVINYL ALCOHOL 1.4 % OP SOLN
1.0000 [drp] | Freq: Four times a day (QID) | OPHTHALMIC | Status: DC | PRN
  Filled 2019-12-30: qty 15

## 2019-12-30 MED ORDER — MIDAZOLAM HCL 2 MG/2ML IJ SOLN: 2.0000 mg | INTRAMUSCULAR | Status: DC | PRN

## 2019-12-30 MED ORDER — LORAZEPAM 2 MG/ML IJ SOLN: 1.0000 mg | INTRAMUSCULAR | Status: DC | PRN

## 2019-12-30 MED ORDER — GLYCOPYRROLATE 1 MG PO TABS
1.0000 mg | ORAL_TABLET | ORAL | Status: DC | PRN
  Filled 2019-12-30: qty 1

## 2019-12-30 MED ORDER — CALCIUM GLUCONATE-NACL 2-0.675 GM/100ML-% IV SOLN
2.0000 g | Freq: Once | INTRAVENOUS | Status: AC
  Administered 2019-12-30: 2000 mg via INTRAVENOUS
  Filled 2019-12-30: qty 100

## 2019-12-30 MED ORDER — MORPHINE 100MG IN NS 100ML (1MG/ML) PREMIX INFUSION
0.0000 mg/h | INTRAVENOUS | Status: DC
  Administered 2019-12-30: 5 mg/h via INTRAVENOUS
  Filled 2019-12-30: qty 100

## 2019-12-30 MED ORDER — ACETAMINOPHEN 325 MG PO TABS: 650.0000 mg | ORAL_TABLET | Freq: Four times a day (QID) | ORAL | Status: DC | PRN

## 2019-12-30 MED ORDER — MIDAZOLAM HCL 2 MG/2ML IJ SOLN
INTRAMUSCULAR | Status: AC
  Administered 2019-12-30: 2 mg via INTRAVENOUS
  Filled 2019-12-30: qty 4

## 2019-12-30 MED ORDER — GLYCOPYRROLATE 0.2 MG/ML IJ SOLN: 0.2000 mg | INTRAMUSCULAR | Status: DC | PRN

## 2019-12-30 MED ORDER — DIPHENHYDRAMINE HCL 50 MG/ML IJ SOLN: 25.0000 mg | INTRAMUSCULAR | Status: DC | PRN

## 2019-12-30 MED ORDER — MORPHINE SULFATE (PF) 2 MG/ML IV SOLN
2.0000 mg | INTRAVENOUS | Status: DC | PRN
  Administered 2019-12-30: 2 mg via INTRAVENOUS
  Filled 2019-12-30: qty 1

## 2019-12-30 MED ORDER — ACETAMINOPHEN 650 MG RE SUPP: 650.0000 mg | Freq: Four times a day (QID) | RECTAL | Status: DC | PRN

## 2019-12-30 MED ORDER — MORPHINE BOLUS VIA INFUSION
5.0000 mg | INTRAVENOUS | Status: DC | PRN
  Filled 2019-12-30: qty 5

## 2019-12-30 MED ORDER — DEXTROSE 5 % IV SOLN: INTRAVENOUS | Status: DC

## 2019-12-31 LAB — CULTURE, BLOOD (ROUTINE X 2)
Culture: NO GROWTH
Culture: NO GROWTH
Special Requests: ADEQUATE
Special Requests: ADEQUATE

## 2020-01-02 NOTE — Progress Notes (Signed)
  Chaplain On-Call responded to page from Nurse with request from the patient's wife for prayer.  Chaplain provided spiritual and emotional support and prayer at bedside for patient's wife Mason Johnson.  Provided information for next [place] for follow-up today.  [place] Kyrstyn Greear M.Div., [facility]

## 2020-01-02 NOTE — Progress Notes (Signed)
GOALS OF CARE DISCUSSION  The Clinical status was relayed to family in detail-Wife and Daughters at bedside.  Updated and notified of patients medical condition.  Explained to family course of therapy and the modalities     Patient with Progressive multiorgan failure with very low chance of meaningful recovery despite all aggressive and optimal medical therapy. Patient is in the Dying  Process associated with Suffering.  Family understands the situation.  They have consented and agreed to DNR/DNI and would like to proceed with Comfort care measures when time comes, they are visiting now.  Family are satisfied with Plan of action and management. All questions answered  Additional CC time 32 mins   Mason Johnson Patricia Pesa, M.D.  [facility]  Medical Director [place] Director [facility] Cardio-Pulmonary Department

## 2020-01-02 NOTE — Progress Notes (Addendum)
   01/03/2020 1215  Clinical Encounter Type  Visited With Patient and family together  Visit Type Spiritual support;Death  Referral From Nurse  Consult/Referral To Chaplain  Chaplain responded to a pg. When she arrived at the unit, she saw son-in-law and grandchildren in the waiting room. When chaplain arrived at the room she saw a young lady hugging and praying with Pt's wife, his two daughters were at bedside along with his other son-in-law and a niece. Chaplain stood by quietly and when the pray was over, she hugged Pt's wife. Chaplain stayed with family for over seventy-five minutes. Before he left, chaplain spoke to Pt's son-in-law regarding his relationship with his father-in-law. While talking with her husband his daughter told chaplain about the death of her brother and of her father-in-law. Family needed hot coffee and water, therefore chaplain asked unit secretary to order a new comfort cart. Before leaving chaplain spoke to daughter, Caryl Pina about them completing the funeral release.

## 2020-01-02 NOTE — Death Summary Note (Signed)
DEATH SUMMARY   Patient Details  Name: Mason Johnson MRN: [id number] DOB: 24-Jul-1950  Admission/Discharge Information   Admit Date:  Jan 05, 2020  Date of Death:    Time of Death:    Length of Stay: 4  Referring Physician: Arnetha Courser, MD   Reason(s) for Hospitalization  Mason Johnson  Diagnoses  Preliminary cause of death: CARDIAC Mason Secondary Diagnoses (including complications and co-morbidities):  Active Problems:   Cardiogenic Johnson ([place])   Cardiac Mason ([place])  SIGNIFICANT EVENTS Jan 05, 2023 admitted for severe Johnson Patient is DNR/DNI and refuses HD 11/25 remains on 3 pressors 11/26 more confused, on pressors 11/27 remains critically ill 11/28 patient is in dying process, family meeting held     [facility] Course (including significant findings, care, treatment, and services provided and events leading to death)  69 year old gentleman with history of multiple myeloma, cardiac amyloid, hyperlipidemia, diabetes who presents due to volume overloadand Johnson He was diagnosed with multiple myeloma in June 2021, currently on chemotherapy. 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   GOALS OF CARE DISCUSSION  The Clinical status was relayed to family in detail.  Updated and notified of patients medical condition.    patient with increased WOB and using accessory muscles to breathe Explained to family course of therapy and the modalities     Patient with Progressive multiorgan failure with very low chance of meaningful recovery despite all aggressive and optimal medical therapy. Patient is in the Dying  Process associated with Suffering.  Family understands the situation.  They have consented and agreed to DNR/DNI and would like to proceed with Comfort care measures.  Family are satisfied with Plan of action and management. All questions answered   TOD 12:11 PM      Pertinent Labs and  Studies  Significant Diagnostic Studies DG Abd 1 View  Result Date: 12/28/2019 CLINICAL DATA:  Abdominal distention. EXAM: ABDOMEN - 1 VIEW COMPARISON:  Chest x-ray 12/27/2019.  Ultrasound 01-05-20. FINDINGS: Motion artifact noted. Mid abdomen incompletely imaged. No bowel distention or free air noted. Degenerative change thoracolumbar spine and both hips. IMPRESSION: Limited exam as above.  No bowel distention or free air noted. Electronically Signed   By: Mason Johnson  Register   On: 12/28/2019 05:36   US Renal  Result Date: 01/05/2020 CLINICAL DATA:  Acute renal failure EXAM: RENAL / URINARY TRACT ULTRASOUND COMPLETE COMPARISON:  Ultrasound report kidney 12/09/2008 FINDINGS: Right Kidney: Renal measurements: 11.4 x 5.1 x 5.3 cm = volume: 161.2 mL. Cortex is echogenic. No mass. No hydronephrosis Left Kidney: Renal measurements: 11.7 x 6.2 x 5 cm = volume: 187.1 mL. Cortex is echogenic. There is mild hydronephrosis. Complex cystic mass at the upper pole measuring 1.6 x 1.4 x 1.3 cm with multiple septations. Bladder: Not well visualized and likely empty. Other: Suspect subtle contour nodularity of the liver. Small moderate perihepatic ascites. IMPRESSION: 1. Echogenic kidneys bilaterally suggesting medical renal disease. There is mild left hydronephrosis. 2. 1.6 cm complex septated cyst in the upper pole left kidney. When the patient is clinically stable and able to follow directions and hold their breath (preferably as an outpatient) further evaluation with dedicated abdominal MRI should be considered. 3. Possible cirrhotic morphology of liver. Small moderate perihepatic ascites Electronically Signed   By: Mason Johnson M.D.   On: 2020/01/05 17:34   DG Chest Port 1 View  Result Date: 12/27/2019 CLINICAL DATA:  Acute respiratory failure. EXAM: PORTABLE CHEST 1 VIEW COMPARISON:  Chest radiograph 01-05-2020.  FINDINGS: Left IJ central venous catheter tip projects over the superior vena cava. Monitoring leads  overlie the patient. Stable cardiac and mediastinal contours. Minimal heterogeneous opacities lung bases bilaterally. Probable small right pleural effusion. No pneumothorax. IMPRESSION: Bibasilar atelectasis. Probable small right pleural effusion. Electronically Signed   By: Mason Johnson M.D.   On: 12/27/2019 08:46   DG Chest Port 1 View  Result Date: 12/03/2019 CLINICAL DATA:  Central line placement EXAM: PORTABLE CHEST 1 VIEW COMPARISON:  12/28/2019 FINDINGS: Left-sided central venous catheter tip overlies the SVC origin. No pneumothorax. Cardiomegaly with vascular congestion. New hazy pulmonary opacities suggestive of pleural effusions or edema. This is more prominent on the right side. IMPRESSION: 1. Left-sided central venous catheter tip overlies the SVC origin. No pneumothorax. 2. New hazy pulmonary opacities suggestive of pleural effusions and/or edema. Electronically Signed   By: Mason Johnson M.D.   On: 12/14/2019 20:43   DG Chest Port 1 View  Result Date: 12/15/2019 CLINICAL DATA:  Hypotension EXAM: PORTABLE CHEST 1 VIEW COMPARISON:  10/25/2013 FINDINGS: Mild cardiomegaly. Minimal atelectasis left base. No consolidation or effusion. No pneumothorax. IMPRESSION: Mild cardiomegaly. Minimal atelectasis at the left base. Electronically Signed   By: Mason Johnson M.D.   On: 12/08/2019 15:47   ECHOCARDIOGRAM COMPLETE  Result Date: 12/27/2019    ECHOCARDIOGRAM REPORT   Patient Name:   Mason Johnson Date of Exam: 12/06/2019 Medical Rec #:  [id number]        Height:       73.0 in Accession #:    [id number]       Weight:       227.0 lb Date of Birth:  04-02-1950         BSA:          2.271 m Patient Age:    1 years         BP:           66/53 mmHg Patient Gender: M                HR:           92 bpm. Exam Location:  [facility] Procedure: 2D Echo, Cardiac Doppler, Color Doppler and Strain Analysis Indications:     I24.9 Acute Coronary Syndrome  History:         Patient has no prior history of  Echocardiogram examinations.                  Risk Factors:Hypertension, Diabetes and Dyslipidemia. Sleep                  apnea.  Sonographer:     Mason Sports Rodgers-Jones Referring Phys:  [id number] [place] Diagnosing Phys: Kate Sable MD IMPRESSIONS  1. LV GLS is globally abnormal with apical sparing consistent with cardiac Mason. Left ventricular ejection fraction, by estimation, is 60 to 65%. The left ventricle has normal function. The left ventricle has no regional wall motion abnormalities. There is severe concentric left ventricular hypertrophy. Left ventricular diastolic parameters are consistent with Grade II diastolic dysfunction (pseudonormalization). The average left ventricular global longitudinal strain is -15.5 %. The global longitudinal strain is abnormal.  2. Right ventricular systolic function is normal. The right ventricular size is normal. Moderately increased right ventricular wall thickness.  3. Left atrial size was mildly dilated.  4. Right atrial size was mild to moderately dilated.  5. The mitral valve is normal in structure. No evidence of mitral valve regurgitation.  6.  The aortic valve is tricuspid. Aortic valve regurgitation is not visualized. Mild aortic valve sclerosis is present, with no evidence of aortic valve stenosis.  7. The inferior vena cava is normal in size with greater than 50% respiratory variability, suggesting right atrial pressure of 3 mmHg. Conclusion(s)/Recommendation(s): Findings consistent with cardiac amyloid. LV GLS is globally abnormal with apical sparing. FINDINGS  Left Ventricle: LV GLS is globally abnormal with apical sparing consistent with cardiac Mason. Left ventricular ejection fraction, by estimation, is 60 to 65%. The left ventricle has normal function. The left ventricle has no regional wall motion abnormalities. The average left ventricular global longitudinal strain is -15.5 %. The global longitudinal strain is abnormal. The left  ventricular internal cavity size was small. There is severe concentric left ventricular hypertrophy. Left ventricular diastolic parameters are consistent with Grade II diastolic dysfunction (pseudonormalization). Right Ventricle: The right ventricular size is normal. Moderately increased right ventricular wall thickness. Right ventricular systolic function is normal. Left Atrium: Left atrial size was mildly dilated. Right Atrium: Right atrial size was mild to moderately dilated. Pericardium: There is no evidence of pericardial effusion. Mitral Valve: The mitral valve is normal in structure. No evidence of mitral valve regurgitation. Tricuspid Valve: The tricuspid valve is grossly normal. Tricuspid valve regurgitation is trivial. Aortic Valve: The aortic valve is tricuspid. Aortic valve regurgitation is not visualized. Mild aortic valve sclerosis is present, with no evidence of aortic valve stenosis. Pulmonic Valve: The pulmonic valve was not well visualized. Pulmonic valve regurgitation is not visualized. Aorta: The aortic root is normal in size and structure. Venous: The inferior vena cava is normal in size with greater than 50% respiratory variability, suggesting right atrial pressure of 3 mmHg. IAS/Shunts: No atrial level shunt detected by color flow Doppler.  LEFT VENTRICLE PLAX 2D LVIDd:         4.14 cm  Diastology LVIDs:         2.50 cm  LV e' medial:    4.57 cm/s LV PW:         1.93 cm  LV E/e' medial:  23.2 LV IVS:        1.90 cm  LV e' lateral:   5.11 cm/s LVOT diam:     2.40 cm  LV E/e' lateral: 20.7 LV SV:         118 LV SV Index:   52       2D Longitudinal Strain LVOT Area:     4.52 cm 2D Strain GLS Avg:     -15.5 %  RIGHT VENTRICLE             IVC RV Basal diam:  4.25 cm     IVC diam: 1.89 cm RV S prime:     14.00 cm/s TAPSE (M-mode): 2.0 cm LEFT ATRIUM             Index       RIGHT ATRIUM           Index LA diam:        5.40 cm 2.38 cm/m  RA Area:     26.90 cm LA Vol (A2C):   50.6 ml 22.28 ml/m RA  Volume:   108.00 ml 47.56 ml/m LA Vol (A4C):   63.2 ml 27.83 ml/m LA Biplane Vol: 58.3 ml 25.68 ml/m  AORTIC VALVE LVOT Vmax:   176.00 cm/s LVOT Vmean:  140.000 cm/s LVOT VTI:    0.260 m  AORTA Ao Root diam: 3.30 cm MITRAL VALVE MV Area (  PHT): 3.72 cm     SHUNTS MV Decel Time: 204 msec     Systemic VTI:  0.26 m MV E velocity: 106.00 cm/s  Systemic Diam: 2.40 cm MV A velocity: 122.00 cm/s MV E/A ratio:  0.87 Kate Sable MD Electronically signed by Kate Sable MD Signature Date/Time: 12/27/2019/11:26:48 AM    Final    US ABDOMEN LIMITED RUQ (LIVER/GB)  Result Date: 12/16/2019 CLINICAL DATA:  69 year old male with hyperbilirubinemia. EXAM: ULTRASOUND ABDOMEN LIMITED RIGHT UPPER QUADRANT COMPARISON:  None. FINDINGS: Gallbladder: The gallbladder is filled with sludge. There is no gallbladder wall thickening. Negative sonographic Murphy's sign. Common bile duct: Diameter: 4 mm Liver: The liver demonstrates a coarsened echotexture. There is mild surface irregularity concerning for changes of cirrhosis. Portal vein is patent on color Doppler imaging with normal direction of blood flow towards the liver. Other: Small ascites.  Partially visualized right pleural effusion. IMPRESSION: 1. Sludge filled gallbladder without sonographic findings of acute cholecystitis. 2. Probable cirrhosis.  Clinical correlation is recommended. 3. Patent main portal vein with hepatopetal flow. 4. Small ascites and right pleural effusion. Electronically Signed   By: Anner Crete M.D.   On: 12/04/2019 17:32    Microbiology Recent Results (from the past 240 hour(s))  Resp Panel by RT-PCR (Flu A&B, Covid) Nasopharyngeal Swab     Status: None   Collection Time: 12/21/2019  3:11 PM   Specimen: Nasopharyngeal Swab; Nasopharyngeal(NP) swabs in vial transport medium  Result Value Ref Range Status   SARS Coronavirus 2 by RT PCR NEGATIVE NEGATIVE Final    Comment: (NOTE) SARS-CoV-2 target nucleic acids are NOT  DETECTED.  The SARS-CoV-2 RNA is generally detectable in upper respiratory specimens during the acute phase of infection. The lowest concentration of SARS-CoV-2 viral copies this assay can detect is 138 copies/mL. A negative result does not preclude SARS-Cov-2 infection and should not be used as the sole basis for treatment or other patient management decisions. A negative result may occur with  improper specimen collection/handling, submission of specimen other than nasopharyngeal swab, presence of viral mutation(s) within the areas targeted by this assay, and inadequate number of viral copies(<138 copies/mL). A negative result must be combined with clinical observations, patient history, and epidemiological information. The expected result is Negative.  Fact Sheet for Patients:  EntrepreneurPulse.com.au  Fact Sheet for Healthcare Providers:  IncredibleEmployment.be  This test is no t yet approved or cleared by the [place] FDA and  has been authorized for detection and/or diagnosis of SARS-CoV-2 by FDA under an Emergency Use Authorization (EUA). This EUA will remain  in effect (meaning this test can be used) for the duration of the COVID-19 declaration under Section 564(b)(1) of the Act, 21 U.S.C.section 360bbb-3(b)(1), unless the authorization is terminated  or revoked sooner.       Influenza A by PCR NEGATIVE NEGATIVE Final   Influenza B by PCR NEGATIVE NEGATIVE Final    Comment: (NOTE) The Xpert Xpress SARS-CoV-2/FLU/RSV plus assay is intended as an aid in the diagnosis of influenza from Nasopharyngeal swab specimens and should not be used as a sole basis for treatment. Nasal washings and aspirates are unacceptable for Xpert Xpress SARS-CoV-2/FLU/RSV testing.  Fact Sheet for Patients: EntrepreneurPulse.com.au  Fact Sheet for Healthcare Providers: IncredibleEmployment.be  This test is not yet  approved or cleared by the [place] FDA and has been authorized for detection and/or diagnosis of SARS-CoV-2 by FDA under an Emergency Use Authorization (EUA). This EUA will remain in effect (meaning this test can  be used) for the duration of the COVID-19 declaration under Section 564(b)(1) of the Act, 21 U.S.C. section 360bbb-3(b)(1), unless the authorization is terminated or revoked.  Performed at [facility], [address] [place], [place] [address]   Blood culture (routine x 2)     Status: None (Preliminary result)   Collection Time: 12/06/2019  3:11 PM   Specimen: BLOOD  Result Value Ref Range Status   Specimen Description BLOOD BLOOD LEFT FOREARM  Final   Special Requests   Final    BOTTLES DRAWN AEROBIC AND ANAEROBIC Blood Culture adequate volume   Culture   Final    NO GROWTH 4 DAYS Performed at [facility], [address], [place], [place] [address]    Report Status PENDING  Incomplete  Urine Culture     Status: None   Collection Time: 12/06/2019  3:11 PM   Specimen: Urine, Random  Result Value Ref Range Status   Specimen Description   Final    URINE, RANDOM Performed at [facility], [address] [place], [place] [address]    Special Requests   Final    NONE Performed at [facility], [address], [place], [place] [address]    Culture   Final    NO GROWTH Performed at [facility], [place] [address], [place], [place] [address]    Report Status 12/28/2019 FINAL  Final  Blood culture (routine x 2)     Status: None (Preliminary result)   Collection Time: 12/21/2019  3:48 PM   Specimen: BLOOD  Result Value Ref Range Status   Specimen Description BLOOD LEFT ANTECUBITAL  Final   Special Requests   Final    BOTTLES DRAWN AEROBIC AND ANAEROBIC Blood Culture adequate volume   Culture   Final    NO GROWTH 4 DAYS Performed at [facility], [address] [place], [place] [address]    Report Status PENDING   Incomplete  MRSA PCR Screening     Status: None   Collection Time: 12/04/2019  7:32 PM   Specimen: Nasopharyngeal  Result Value Ref Range Status   MRSA by PCR NEGATIVE NEGATIVE Final    Comment:        The GeneXpert MRSA Assay (FDA approved for NASAL specimens only), is one component of a comprehensive MRSA colonization surveillance program. It is not intended to diagnose MRSA infection nor to guide or monitor treatment for MRSA infections. Performed at [facility], [address] [place], [place] [address]     Lab Basic Metabolic Panel: Recent Labs  Lab 12/20/2019 1511 12/29/2019 1511 12/15/2019 2342 12/27/19 0340 12/28/19 0405 12/29/19 0520 01/29/20 0352  NA 127*   < > 123* 124* 125* 125* 124*  K 4.8   < > 4.9 4.9 4.9 5.0 5.2*  CL 87*   < > 85* 85* 88* 89* 88*  CO2 21*   < > 19* 18* 19* 19* 19*  GLUCOSE 79   < > 133* 121* 150* 139* 129*  BUN 64*   < > 67* 66* 74* 85* 91*  CREATININE 6.15*   < > 6.19* 6.19* 6.75* 7.09* 7.56*  CALCIUM 7.8*   < > 7.4* 7.3* 6.8* 6.3* 5.8*  MG 2.2  --   --  2.2 2.1 2.1 2.3  PHOS  --   --   --  6.5* 7.4* 7.8* 8.0*   < > = values in this interval not displayed.   Liver Function Tests:  Recent Labs  Lab 12/17/2019 1511 12/27/19 0340 12/28/19 0405 12/29/19 0520 29-Jan-2020 0352  AST 195* 244* 282* 354* 392*  ALT 81* 94* 106* 121* 123*  ALKPHOS 413* 464* 444* 413* 413*  BILITOT 7.6* 8.7* 8.6* 7.6* 7.7*  PROT 4.2* 4.7* 4.3* 4.1* 3.9*  ALBUMIN 1.8* 2.0* 1.8* 1.6* 1.4*   No results for input(s): LIPASE, AMYLASE in the last 168 hours. No results for input(s): AMMONIA in the last 168 hours. CBC: Recent Labs  Lab 12/15/2019 1511 12/27/19 0340 12/28/19 0405 12/29/19 0520 2020-01-29 0352  WBC 10.4 12.2* 13.7* 15.6* 14.3*  NEUTROABS  --  10.0* 11.7* 12.8* 11.6*  HGB 12.5* 12.2* 12.0* 12.6* 12.9*  HCT RESULTS UNAVAILABLE DUE TO INTERFERING SUBSTANCE RESULTS UNAVAILABLE DUE TO INTERFERING SUBSTANCE RESULTS UNAVAILABLE DUE TO INTERFERING  SUBSTANCE RESULTS UNAVAILABLE DUE TO INTERFERING SUBSTANCE RESULTS UNAVAILABLE DUE TO INTERFERING SUBSTANCE  MCV RESULTS UNAVAILABLE DUE TO INTERFERING SUBSTANCE RESULTS UNAVAILABLE DUE TO INTERFERING SUBSTANCE RESULTS UNAVAILABLE DUE TO INTERFERING SUBSTANCE RESULTS UNAVAILABLE DUE TO INTERFERING SUBSTANCE RESULTS UNAVAILABLE DUE TO INTERFERING SUBSTANCE  PLT 132* 137* 115* 110* 84*   Cardiac Enzymes: No results for input(s): CKTOTAL, CKMB, CKMBINDEX, TROPONINI in the last 168 hours. Sepsis Labs: Recent Labs  Lab 12/08/2019 1511 12/29/2019 1511 12/10/2019 1741 12/24/2019 2203 12/27/19 0340 12/28/19 0405 12/29/19 0520 01-29-20 0352  PROCALCITON 4.24  --   --   --   --  9.43  --   --   WBC 10.4   < >  --   --  12.2* 13.7* 15.6* 14.3*  LATICACIDVEN 7.1*  --  7.5* 5.9* 7.3*  --   --   --    < > = values in this interval not displayed.     Flora Lipps 01/29/2020, 12:20 PM

## 2020-01-02 NOTE — Progress Notes (Signed)
CRITICAL CARE NOTE  69 year old gentleman with history of multiple myeloma, cardiac amyloid, hyperlipidemia, diabetes who presents due to volume overload and shock  He was diagnosed with multiple myeloma in June 2021, currently on chemotherapy. 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      SIGNIFICANT EVENTS  11/24 admitted for severe shock Patient is DNR/DNI and refuses HD 11/25 remains on 3 pressors 11/26 more confused, on pressors 11/27 remains critically ill 11/28 patient is in dying process   CC  follow up respiratory failure  SUBJECTIVE Patient remains critically ill Prognosis is guarded Patient states that he is tired and does NOT want to suffer anymore   BP 97/71   Pulse 88   Temp 97.8 F (36.6 C) (Axillary)   Resp 17   Ht _0  (1.854 m)   Wt 104.6 kg   SpO2 90%   BMI 30.42 kg/m    I/O last 3 completed shifts: In: 5062.3 [I.V.:4669; IV Piggyback:393.4] Out: 205 [Urine:205] No intake/output data recorded.  SpO2: 90 % O2 Flow Rate (L/min): 2 L/min  Estimated body mass index is 30.42 kg/m as calculated from the following:   Height as of this encounter: _1  (1.854 m).   Weight as of this encounter: 104.6 kg.  SIGNIFICANT EVENTS   REVIEW OF SYSTEMS LIMITED lethargic       PHYSICAL EXAMINATION:  GENERAL:critically ill appearing, +resp distress NECK: Supple.  PULMONARY: +rhonchi, +wheezing CARDIOVASCULAR: S1 and S2. Regular rate and rhythm. No murmurs, rubs, or gallops.  GASTROINTESTINAL: +distended.  Positive bowel sounds.  +tenderness MUSCULOSKELETAL: + edema.  NEUROLOGIC: obtunded, GCS<8 SKIN:intact,warm,dry  MEDICATIONS: I have reviewed all medications and confirmed regimen as documented   CULTURE RESULTS   Recent Results (from the past 240 hour(s))  Resp Panel by RT-PCR (Flu A&B, Covid) Nasopharyngeal Swab     Status: None   Collection Time: 12/12/2019  3:11 PM   Specimen: Nasopharyngeal Swab;  Nasopharyngeal(NP) swabs in vial transport medium  Result Value Ref Range Status   SARS Coronavirus 2 by RT PCR NEGATIVE NEGATIVE Final    Comment: (NOTE) SARS-CoV-2 target nucleic acids are NOT DETECTED.  The SARS-CoV-2 RNA is generally detectable in upper respiratory specimens during the acute phase of infection. The lowest concentration of SARS-CoV-2 viral copies this assay can detect is 138 copies/mL. A negative result does not preclude SARS-Cov-2 infection and should not be used as the sole basis for treatment or other patient management decisions. A negative result may occur with  improper specimen collection/handling, submission of specimen other than nasopharyngeal swab, presence of viral mutation(s) within the areas targeted by this assay, and inadequate number of viral copies(<138 copies/mL). A negative result must be combined with clinical observations, patient history, and epidemiological information. The expected result is Negative.  Fact Sheet for Patients:  EntrepreneurPulse.com.au  Fact Sheet for Healthcare Providers:  IncredibleEmployment.be  This test is no t yet approved or cleared by the [place] FDA and  has been authorized for detection and/or diagnosis of SARS-CoV-2 by FDA under an Emergency Use Authorization (EUA). This EUA will remain  in effect (meaning this test can be used) for the duration of the COVID-19 declaration under Section 564(b)(1) of the Act, 21 U.S.C.section 360bbb-3(b)(1), unless the authorization is terminated  or revoked sooner.       Influenza A by PCR NEGATIVE NEGATIVE Final   Influenza B by PCR NEGATIVE NEGATIVE Final    Comment: (NOTE) The Xpert Xpress SARS-CoV-2/FLU/RSV plus assay is intended as an  aid in the diagnosis of influenza from Nasopharyngeal swab specimens and should not be used as a sole basis for treatment. Nasal washings and aspirates are unacceptable for Xpert Xpress  SARS-CoV-2/FLU/RSV testing.  Fact Sheet for Patients: EntrepreneurPulse.com.au  Fact Sheet for Healthcare Providers: IncredibleEmployment.be  This test is not yet approved or cleared by the [place] FDA and has been authorized for detection and/or diagnosis of SARS-CoV-2 by FDA under an Emergency Use Authorization (EUA). This EUA will remain in effect (meaning this test can be used) for the duration of the COVID-19 declaration under Section 564(b)(1) of the Act, 21 U.S.C. section 360bbb-3(b)(1), unless the authorization is terminated or revoked.  Performed at [facility], [address] [place], [place] [address]   Blood culture (routine x 2)     Status: None (Preliminary result)   Collection Time: 12/25/2019  3:11 PM   Specimen: BLOOD  Result Value Ref Range Status   Specimen Description BLOOD BLOOD LEFT FOREARM  Final   Special Requests   Final    BOTTLES DRAWN AEROBIC AND ANAEROBIC Blood Culture adequate volume   Culture   Final    NO GROWTH 4 DAYS Performed at [facility], [address] [place], [place] [address]    Report Status PENDING  Incomplete  Urine Culture     Status: None   Collection Time: 12/16/2019  3:11 PM   Specimen: Urine, Random  Result Value Ref Range Status   Specimen Description   Final    URINE, RANDOM Performed at [facility], [address] [place], [place] [address]    Special Requests   Final    NONE Performed at [facility], [address] [place], [place] [address]    Culture   Final    NO GROWTH Performed at [facility], [place] [address], [place], [place] [address]    Report Status 12/28/2019 FINAL  Final  Blood culture (routine x 2)     Status: None (Preliminary result)   Collection Time: 12/18/2019  3:48 PM   Specimen: BLOOD  Result Value Ref Range Status   Specimen Description BLOOD LEFT ANTECUBITAL  Final   Special Requests   Final     BOTTLES DRAWN AEROBIC AND ANAEROBIC Blood Culture adequate volume   Culture   Final    NO GROWTH 4 DAYS Performed at [facility], [address], [place], [place] [address]    Report Status PENDING  Incomplete  MRSA PCR Screening     Status: None   Collection Time: 12/19/2019  7:32 PM   Specimen: Nasopharyngeal  Result Value Ref Range Status   MRSA by PCR NEGATIVE NEGATIVE Final    Comment:        The GeneXpert MRSA Assay (FDA approved for NASAL specimens only), is one component of a comprehensive MRSA colonization surveillance program. It is not intended to diagnose MRSA infection nor to guide or monitor treatment for MRSA infections. Performed at [facility], [address] [place], [place] [address]            ASSESSMENT AND PLAN SYNOPSIS 69 yo AAM admitted to ICU for severe shock(cardiogenic and septic shock) with progressive multiorgan failure with renal failure in the setting of progressive end stage cardiac/liver amyloidosis with underlying end stage Multiple myeloma Patient is in dying process and suffering from [place]  Patient is DNR/DNi and refuses HD  ACUTE BIVENTRICULAR CARDIAC FAILURE-  -oxygen as  needed -Lasix as tolerated   Morbid obesity, +OSA.   Will certainly impact respiratory mechanics   ACUTE KIDNEY INJURY/Renal Failure -continue Foley Catheter-assess need -Avoid nephrotoxic agents -Follow urine output, BMP -Ensure adequate renal perfusion, optimize oxygenation -Renal dose medications   SHOCK-SEPSIS/CARDIOGENIC -use vasopressors to keep MAP>65   CARDIAC ICU monitoring  ID -continue IV abx as prescibed -follow up cultures  GI GI PROPHYLAXIS as indicated   DIET-->as tolerated Constipation protocol as indicated  ENDO - will use ICU hypoglycemic\Hyperglycemia protocol if indicated     ELECTROLYTES -follow labs as needed -replace as needed -pharmacy consultation and  following   DVT/GI PRX ordered and assessed TRANSFUSIONS AS NEEDED MONITOR FSBS I Assessed the need for Labs I Assessed the need for Foley I Assessed the need for Central Venous Line Family Discussion when available I Assessed the need for Mobilization I made an Assessment of medications to be adjusted accordingly Safety Risk assessment completed   CASE DISCUSSED IN MULTIDISCIPLINARY ROUNDS WITH ICU TEAM  Critical Care Time devoted to patient care services described in this note is 54 minutes.   Overall, patient is critically ill, prognosis is guarded.  Patient with Multiorgan failure and at high risk for cardiac arrest and death.   PATIENT IS DNR/DNI Plan for family meeting and comfort care  Kelcey Wickstrom Patricia Pesa, M.D.  [facility]  Medical Director [place] Director [facility] Cardio-Pulmonary Department

## 2020-01-02 NOTE — Progress Notes (Signed)
   2020-01-18 0915  Clinical Encounter Type  Visited With Patient and family together  Visit Type Follow-up  Referral From Chaplain  Consult/Referral To Chaplain  Chaplain went in the room to make sure family knew her availability and while entering the room a young man asked for a chair. Chaplain found a chair, sanitized it and took it to the room. Chaplain took family she wanted to give them time with Pt, but if they need her to have her page

## 2020-01-02 DEATH — deceased

## 2020-01-03 LAB — BLOOD GAS, VENOUS
Acid-base deficit: 1.7 mmol/L (ref 0.0–2.0)
Bicarbonate: 21.7 mmol/L (ref 20.0–28.0)
O2 Saturation: 62.6 %
Patient temperature: 37
pCO2, Ven: 32 mmHg — ABNORMAL LOW (ref 44.0–60.0)
pH, Ven: 7.44 — ABNORMAL HIGH (ref 7.250–7.430)

## 2022-05-03 IMAGING — US US RENAL
1 series · 14 of 25 positions shown · non-contrast
Comparison: Ultrasound report kidney 12/09/2008

CLINICAL DATA: Acute renal failure

EXAM:
RENAL / URINARY TRACT ULTRASOUND COMPLETE

[Series 1: us renal · 14 of 107 slices shown]
[im 1/107]
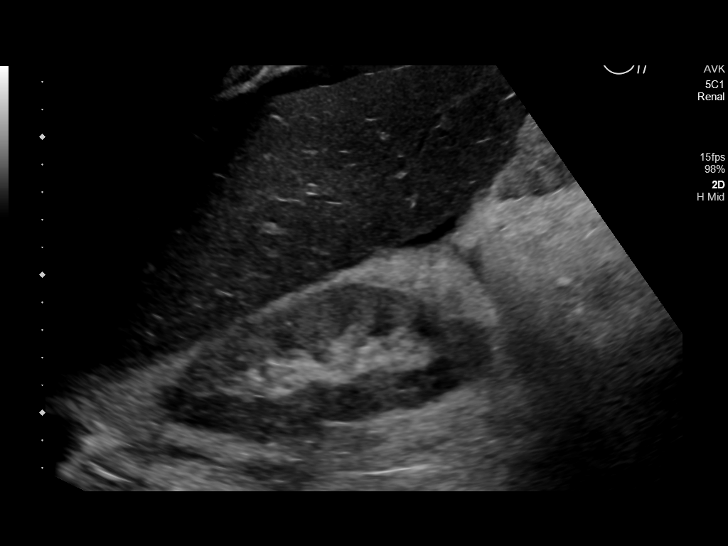
[im 9/107]
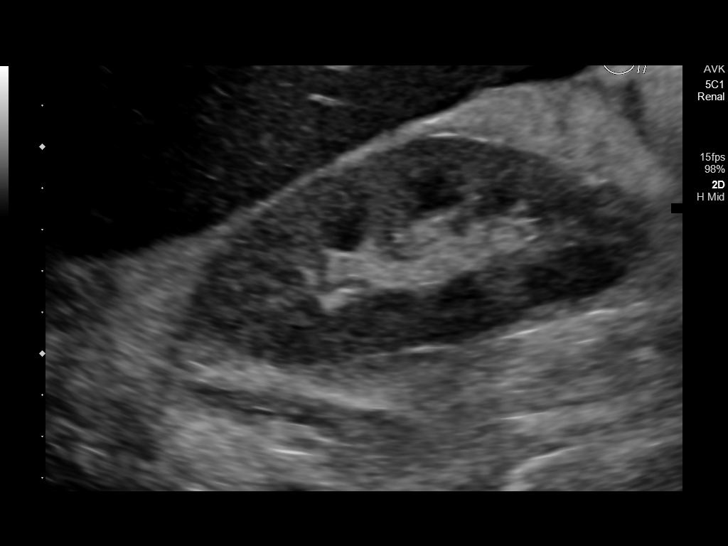
[im 18/107]
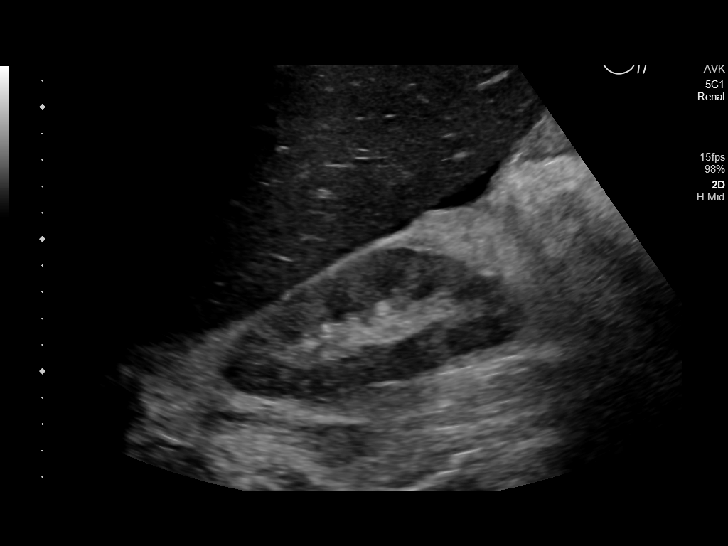
[im 27/107]
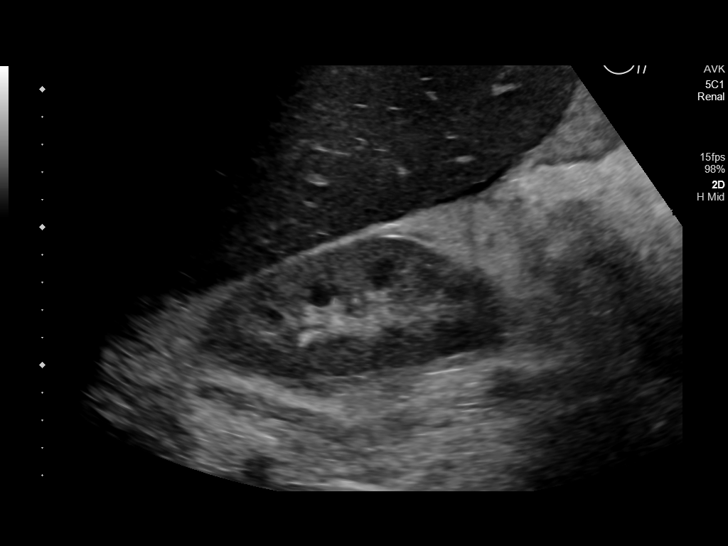
[im 36/107]
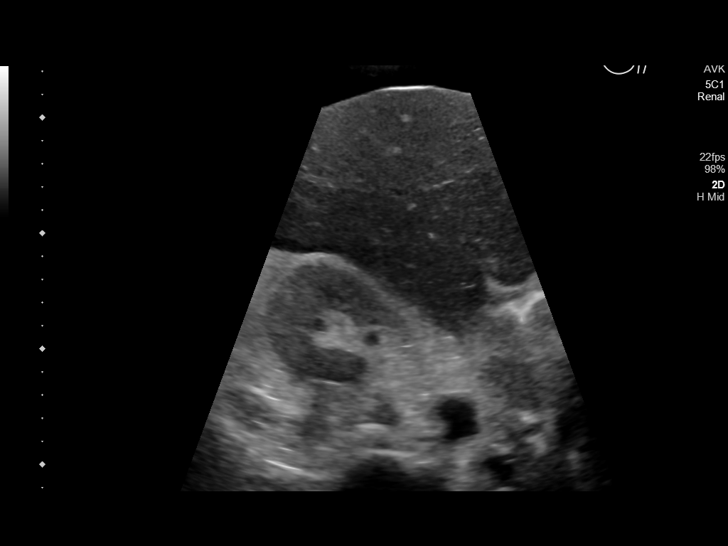
[im 40/107]
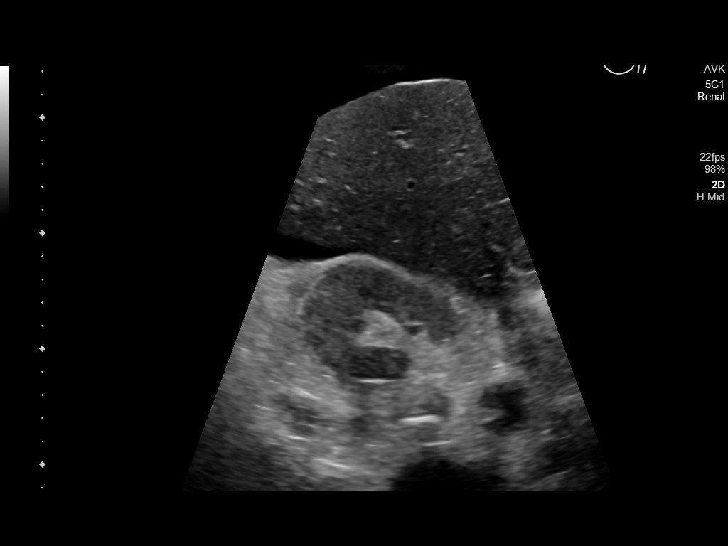
[im 49/107]
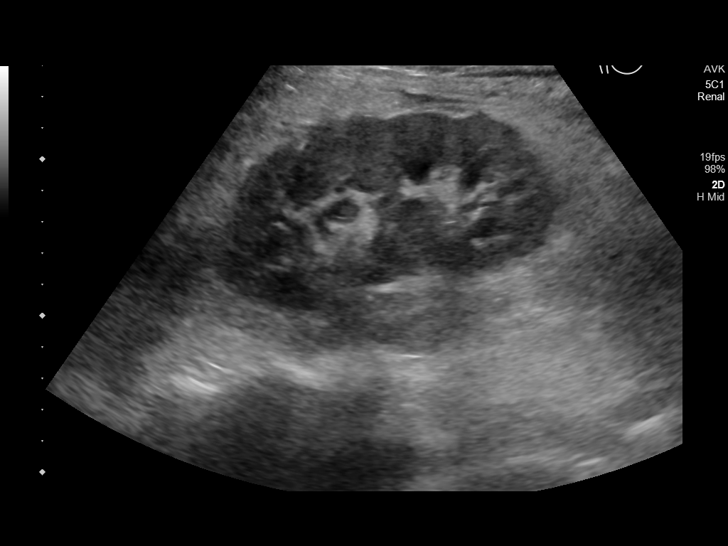
[im 58/107]
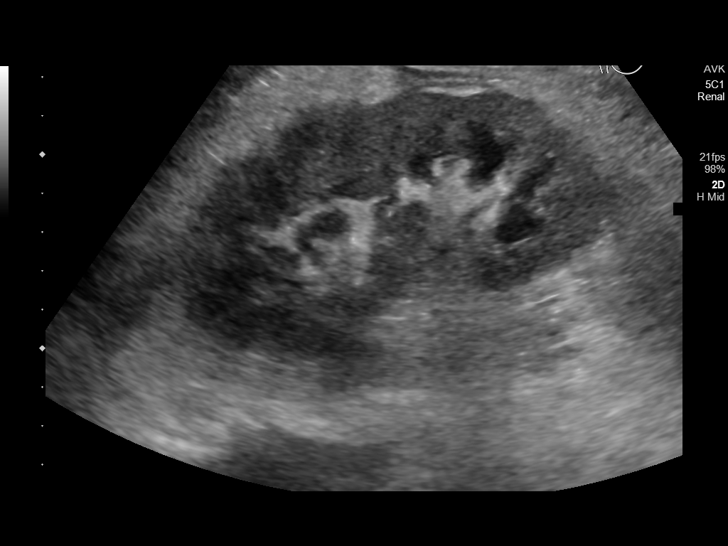
[im 67/107]
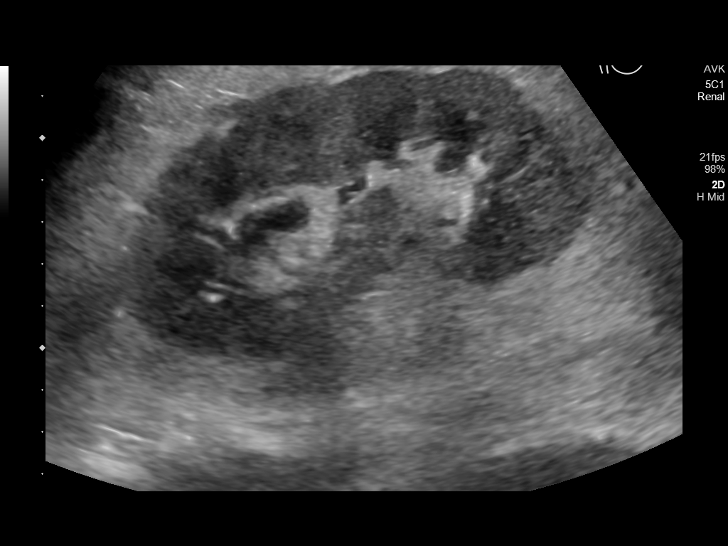
[im 71/107]
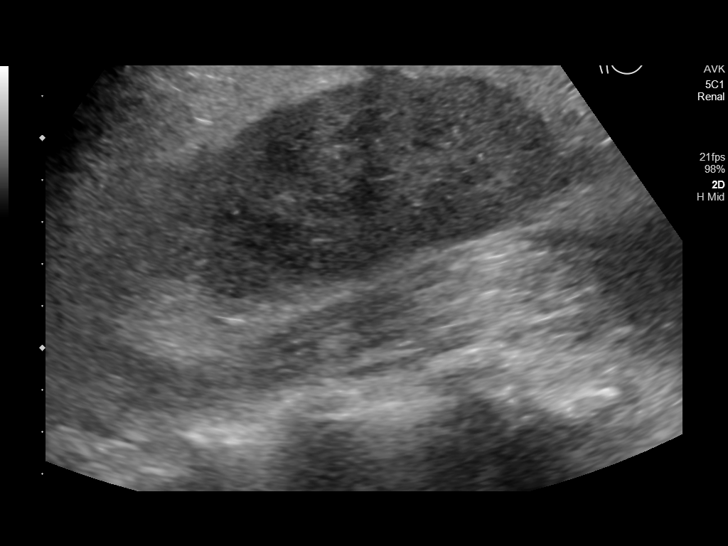
[im 80/107]
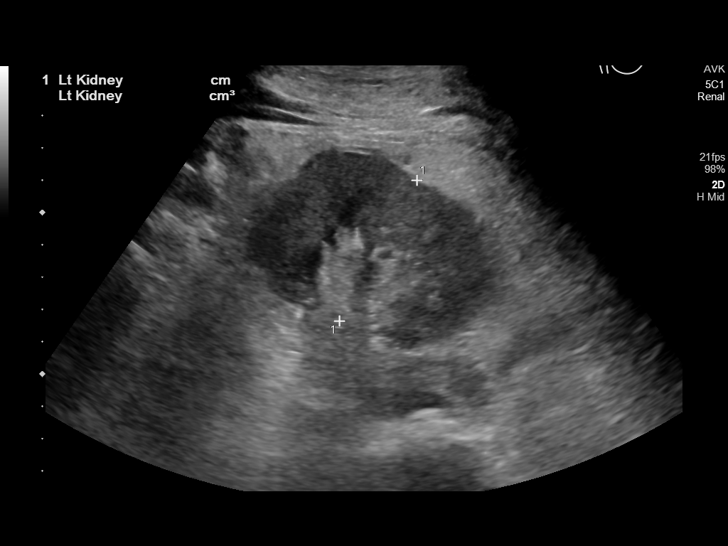
[im 89/107]
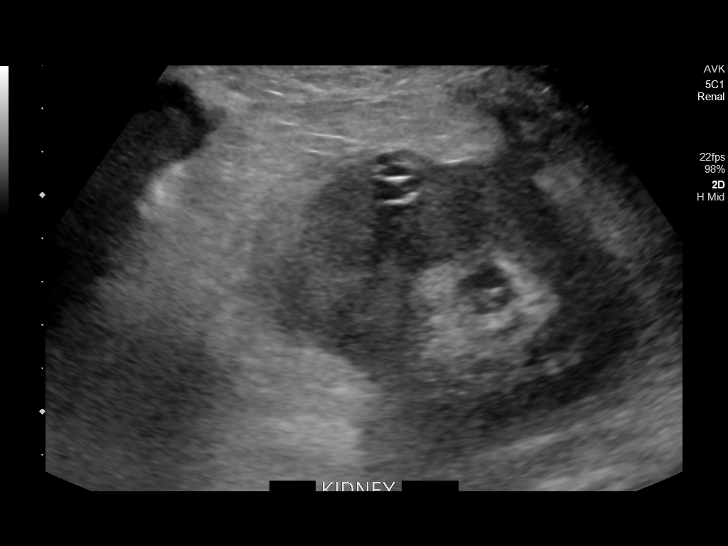
[im 98/107]
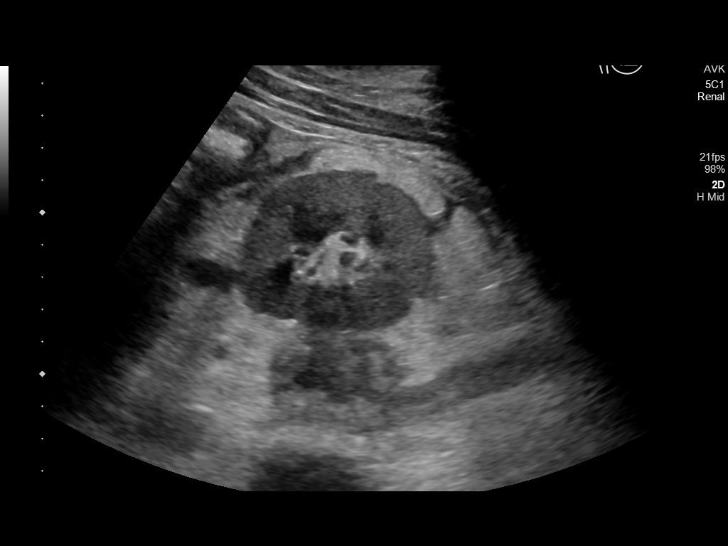
[im 107/107]
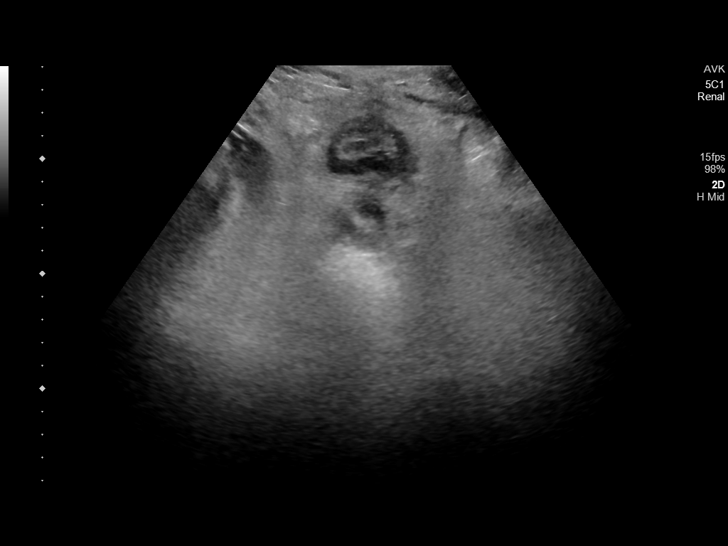

[14 of 25 positions shown; findings below may reference images not displayed]

FINDINGS: Right Kidney:

Renal measurements: 11.4 x 5.1 x 5.3 cm = volume: 161.2 mL. Cortex
is echogenic. No mass. No hydronephrosis

Left Kidney:

Renal measurements: 11.7 x 6.2 x 5 cm = volume: 187.1 mL. Cortex is
echogenic. There is mild hydronephrosis. Complex cystic mass at the
upper pole measuring 1.6 x 1.4 x 1.3 cm with multiple septations.

Bladder:

Not well visualized and likely empty.

Other:

Suspect subtle contour nodularity of the liver. Small moderate
perihepatic ascites.
IMPRESSION: 1. Echogenic kidneys bilaterally suggesting medical renal disease.
There is mild left hydronephrosis.
2. 1.6 cm complex septated cyst in the upper pole left kidney. When
the patient is clinically stable and able to follow directions and
hold their breath (preferably as an outpatient) further evaluation
with dedicated abdominal MRI should be considered.
3. Possible cirrhotic morphology of liver. Small moderate
perihepatic ascites

## 2022-05-03 IMAGING — US US ABDOMEN LIMITED
1 series · 14 of 25 positions shown · non-contrast
Comparison: None.

CLINICAL DATA: 69-year-old male with hyperbilirubinemia.

EXAM:
ULTRASOUND ABDOMEN LIMITED RIGHT UPPER QUADRANT

[Series 1: us abdomen limited ruq (liver/gb) · 14 of 137 slices shown]
[im 1/137]
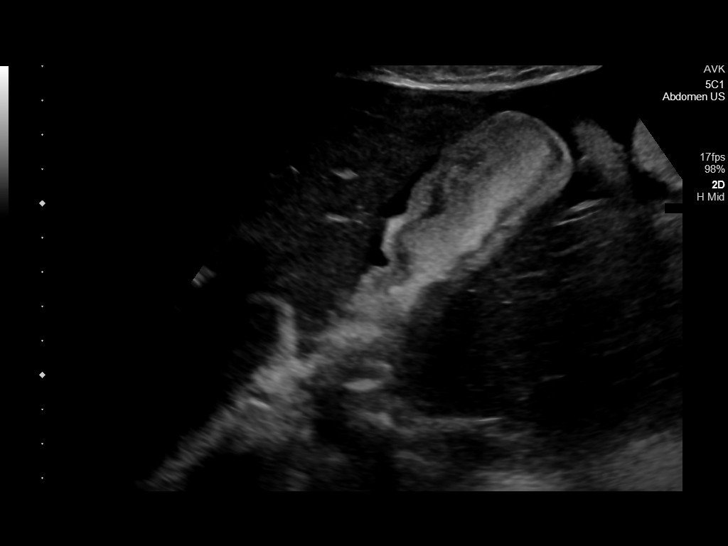
[im 12/137]
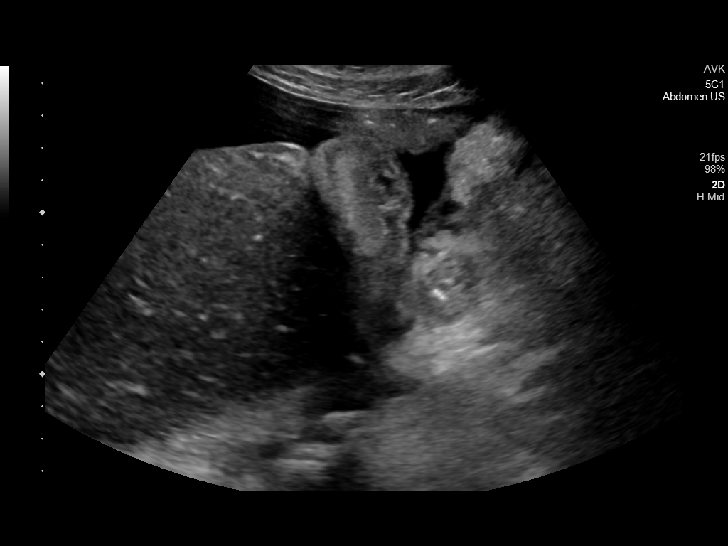
[im 23/137]
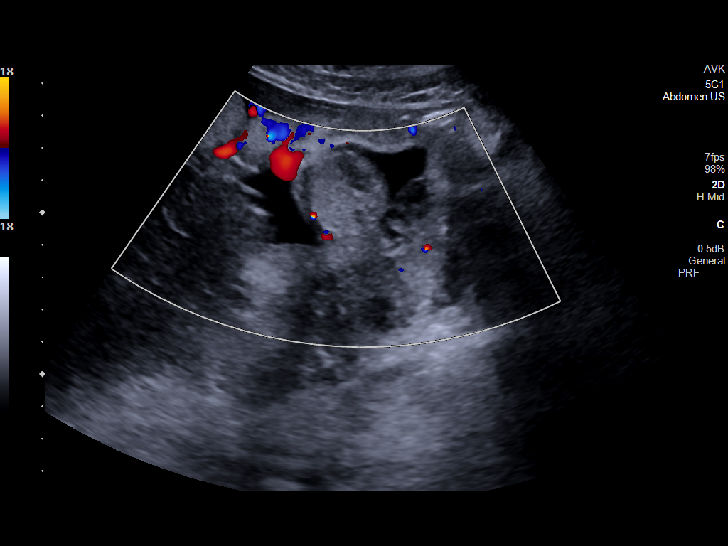
[im 35/137]
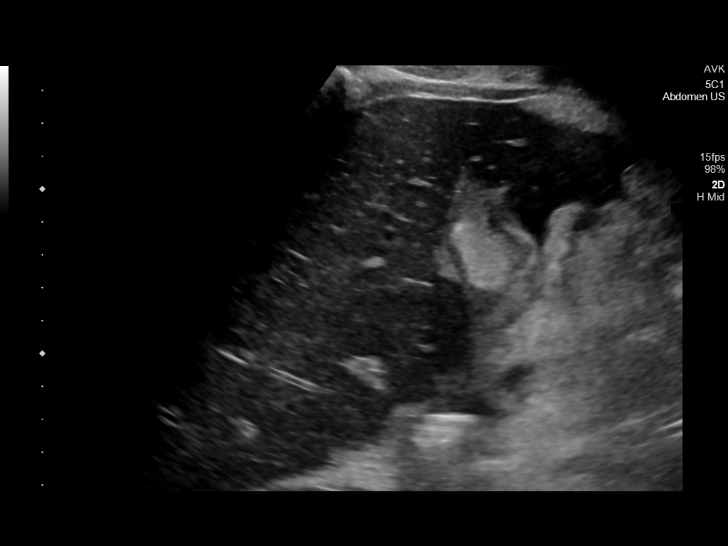
[im 46/137]
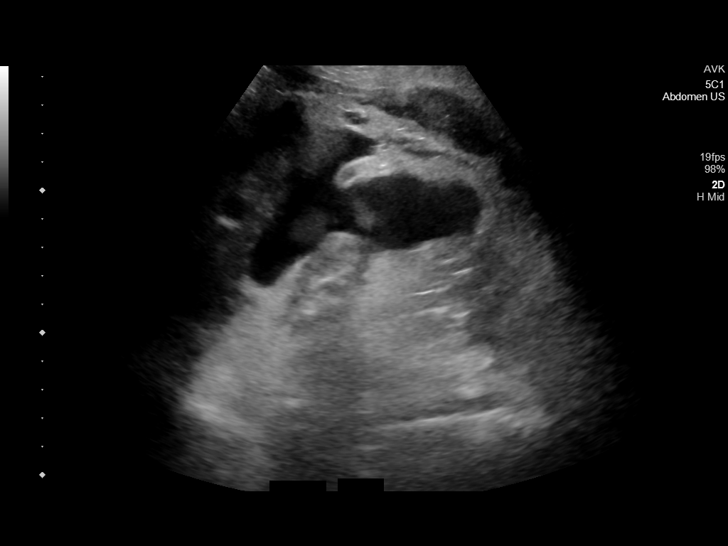
[im 52/137]
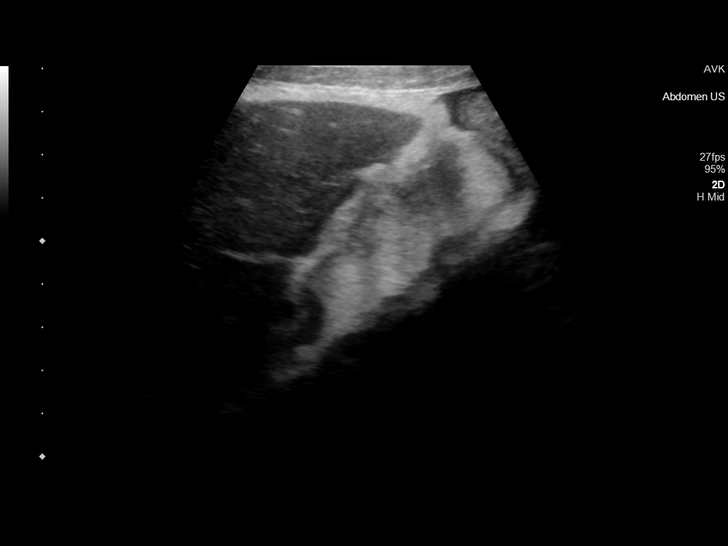
[im 63/137]
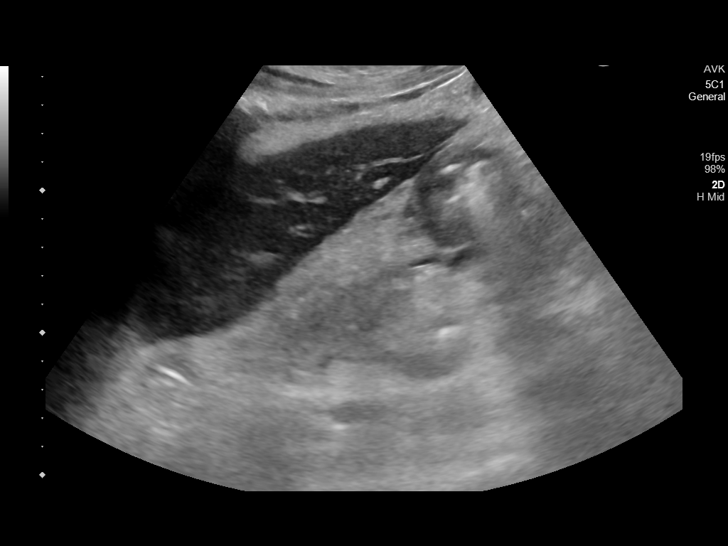
[im 74/137]
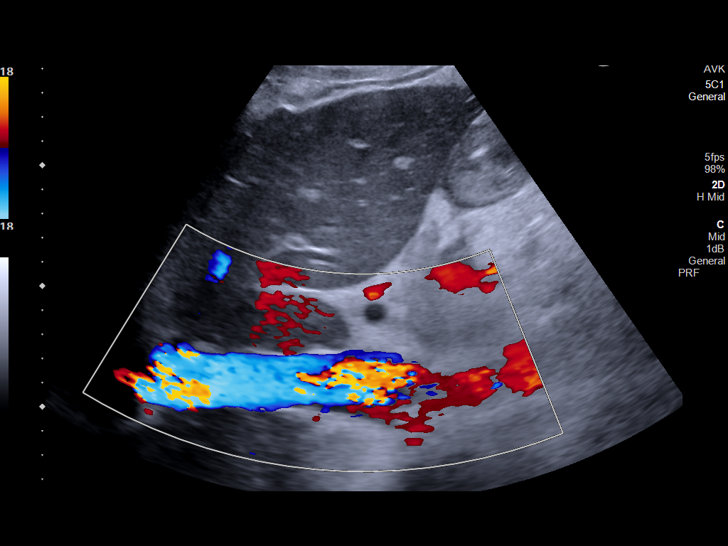
[im 86/137]
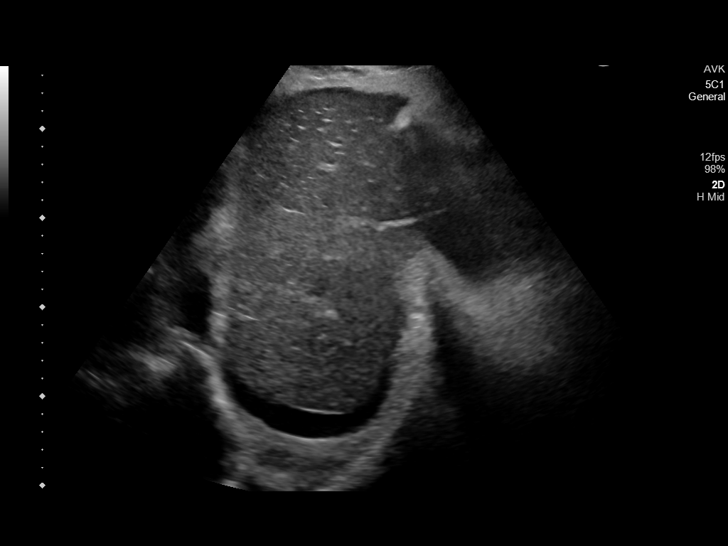
[im 91/137]
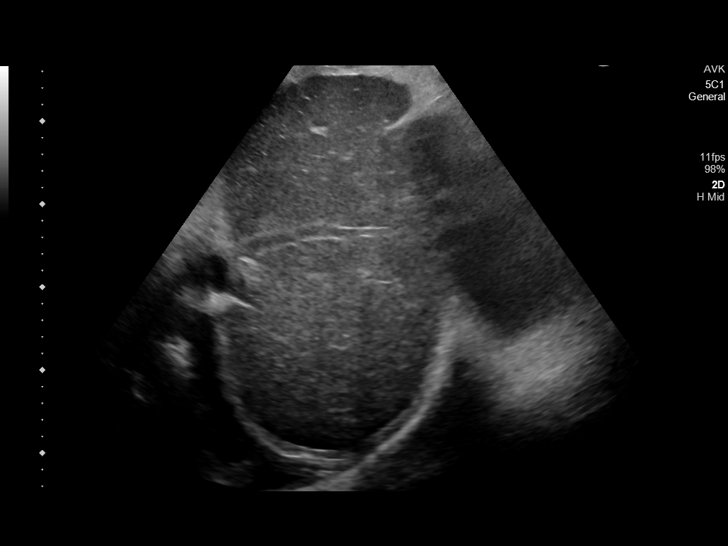
[im 103/137]
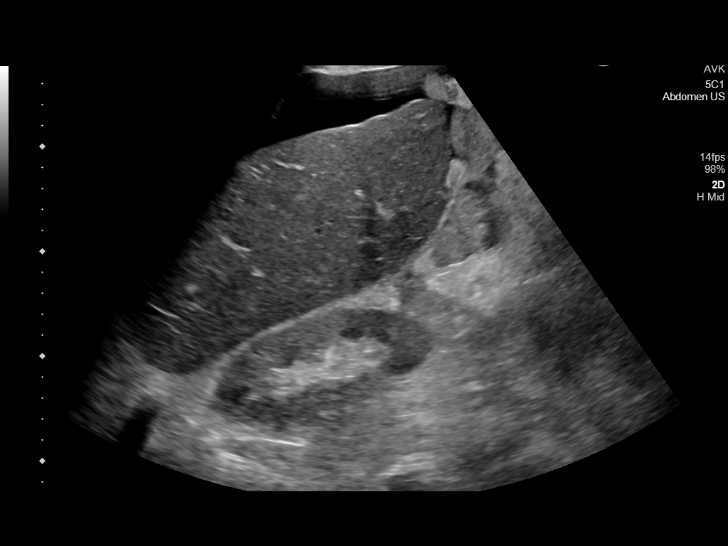
[im 114/137]
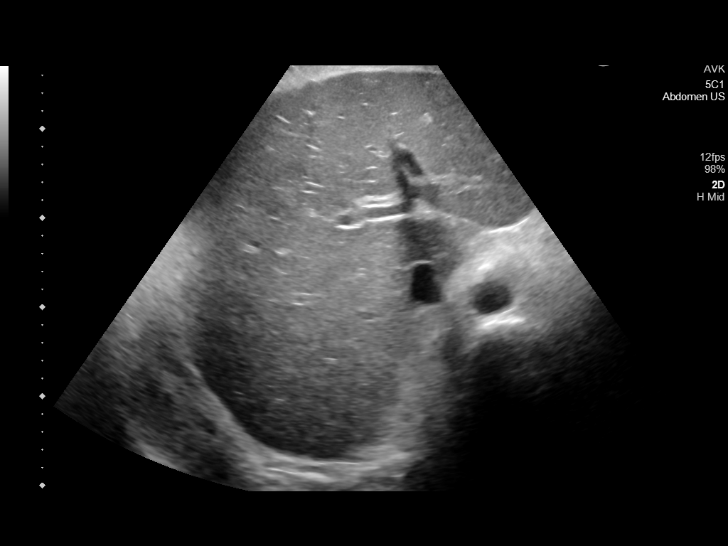
[im 125/137]
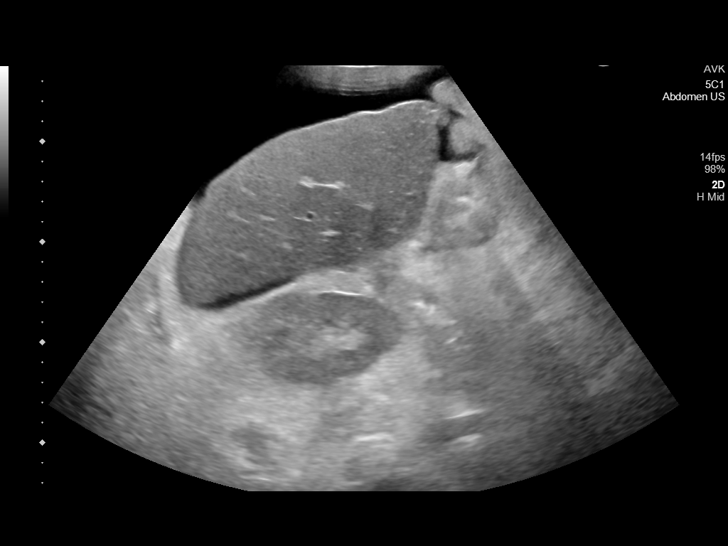
[im 137/137]
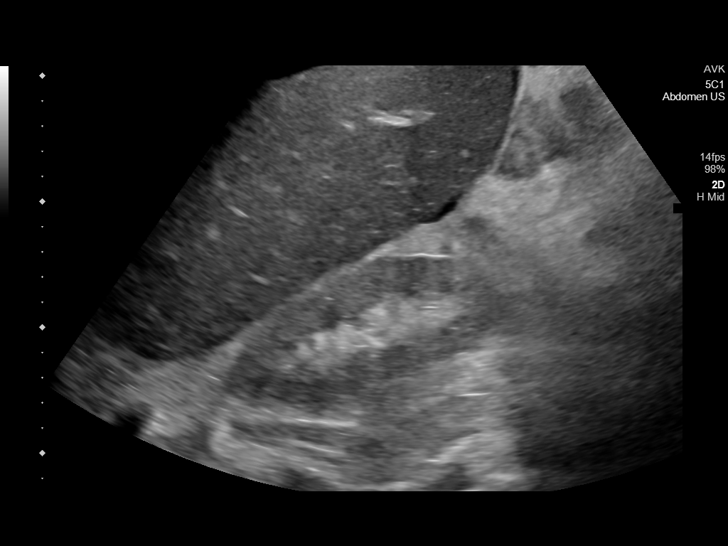

[14 of 25 positions shown; findings below may reference images not displayed]

FINDINGS: Gallbladder:

The gallbladder is filled with sludge. There is no gallbladder wall
thickening. Negative sonographic Murphy's sign.

Common bile duct:

Diameter: 4 mm

Liver:

The liver demonstrates a coarsened echotexture. There is mild
surface irregularity concerning for changes of cirrhosis. Portal
vein is patent on color Doppler imaging with normal direction of
blood flow towards the liver.

Other: Small ascites.  Partially visualized right pleural effusion.
IMPRESSION: 1. Sludge filled gallbladder without sonographic findings of acute
cholecystitis.
2. Probable cirrhosis.  Clinical correlation is recommended.
3. Patent main portal vein with hepatopetal flow.
4. Small ascites and right pleural effusion.

## 2022-05-03 IMAGING — DX DG CHEST 1V PORT
1 series · 1 of 1 positions shown · non-contrast
Comparison: 12/26/2019

CLINICAL DATA: Central line placement

EXAM:
PORTABLE CHEST 1 VIEW

[chest ap]
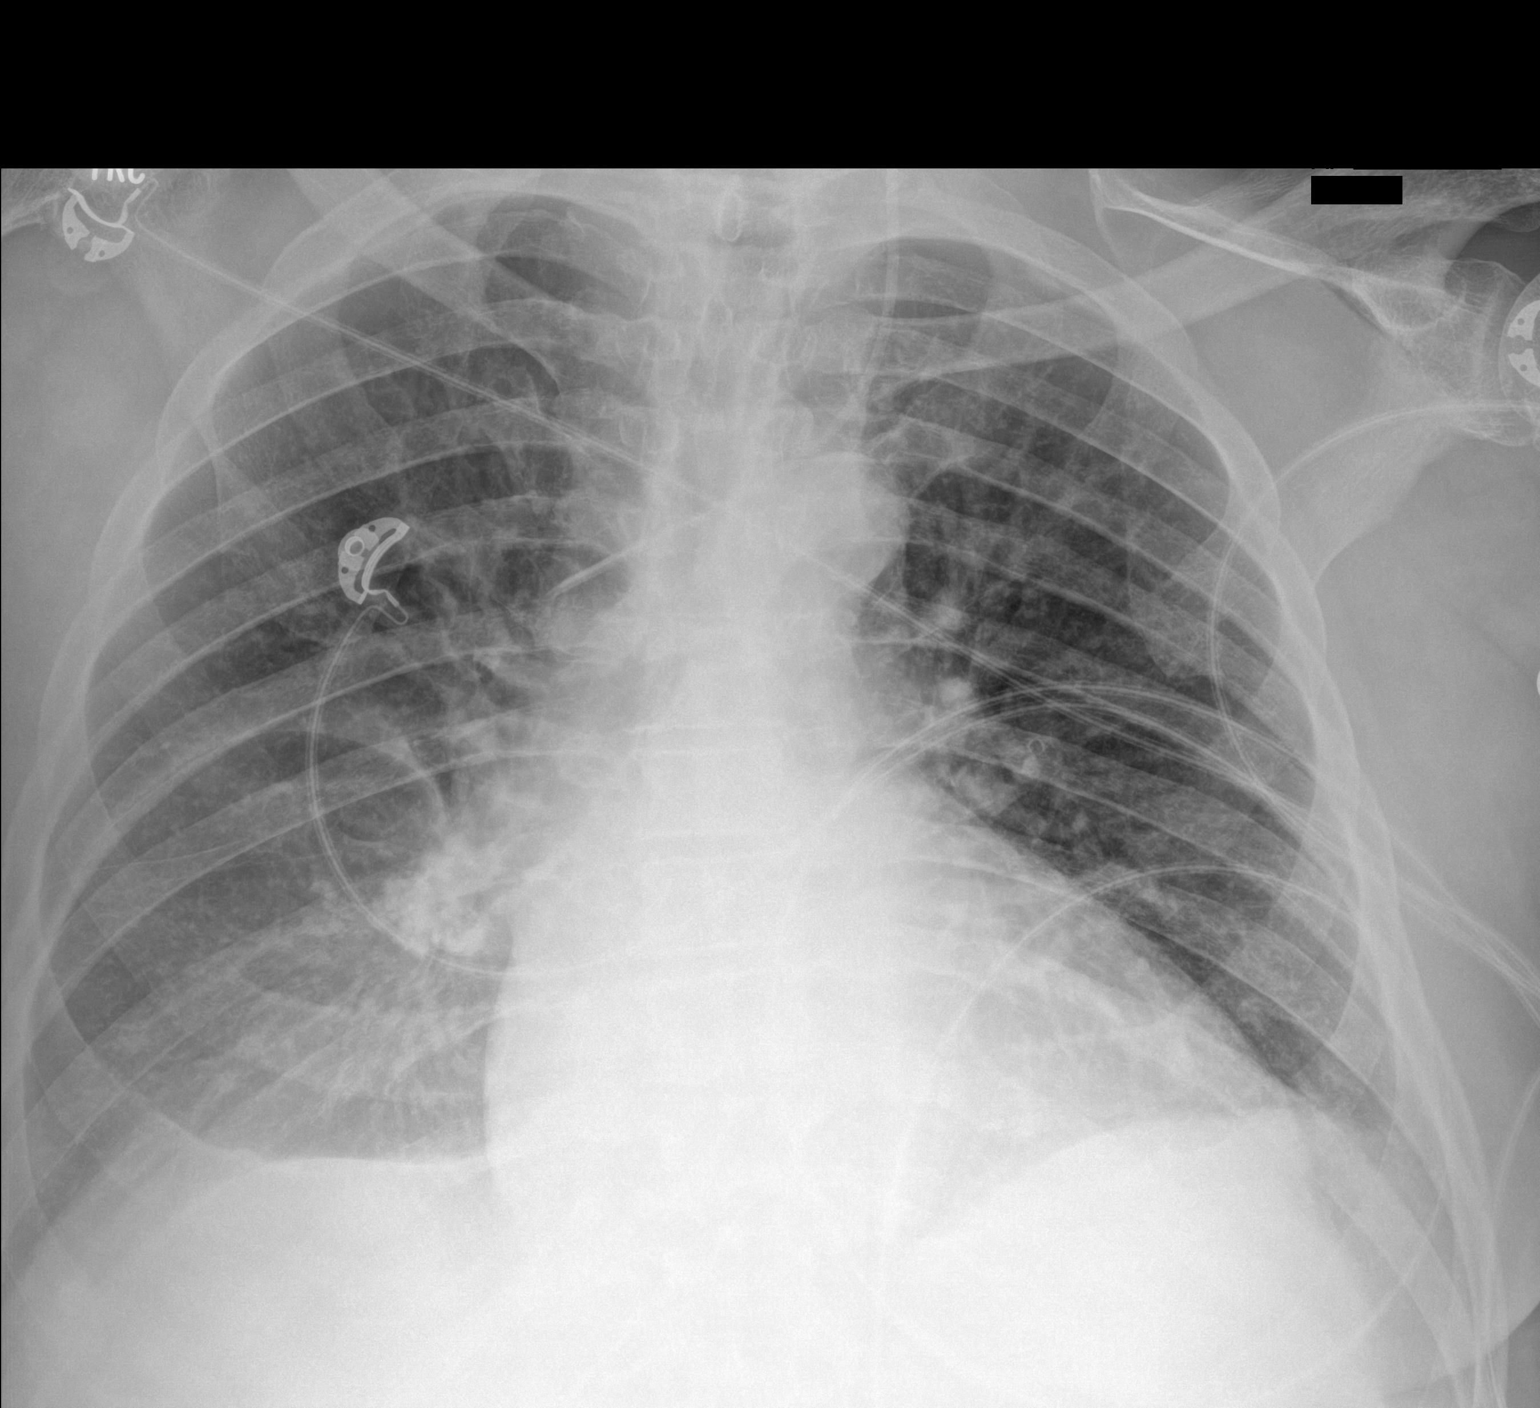

[1 of 1 positions shown; findings below may reference images not displayed]

FINDINGS: Left-sided central venous catheter tip overlies the SVC origin. No
pneumothorax. Cardiomegaly with vascular congestion. New hazy
pulmonary opacities suggestive of pleural effusions or edema. This
is more prominent on the right side.
IMPRESSION: 1. Left-sided central venous catheter tip overlies the SVC origin.
No pneumothorax.
2. New hazy pulmonary opacities suggestive of pleural effusions
and/or edema.
